# Patient Record
Sex: Female | Born: 1980 | Race: White | Hispanic: No | Marital: Married | Smoking: Never smoker
Health system: Southern US, Community
[De-identification: ages and names within clinical notes are randomized; demographics above are authoritative.]

## PROBLEM LIST (undated history)

## (undated) DIAGNOSIS — Z8739 Personal history of other diseases of the musculoskeletal system and connective tissue: Secondary | ICD-10-CM

## (undated) DIAGNOSIS — F840 Autistic disorder: Secondary | ICD-10-CM

## (undated) DIAGNOSIS — I341 Nonrheumatic mitral (valve) prolapse: Secondary | ICD-10-CM

## (undated) HISTORY — PX: WISDOM TOOTH EXTRACTION: SHX21

## (undated) HISTORY — DX: Personal history of other diseases of the musculoskeletal system and connective tissue: Z87.39

## (undated) HISTORY — DX: Nonrheumatic mitral (valve) prolapse: I34.1

## (undated) HISTORY — DX: Autistic disorder: F84.0

## (undated) HISTORY — PX: BACK SURGERY: SHX140

---

## 2002-10-21 ENCOUNTER — Other Ambulatory Visit: Admission: RE | Admit: 2002-10-21 | Discharge: 2002-10-21 | Payer: Self-pay | Admitting: Gynecology

## 2003-12-14 ENCOUNTER — Other Ambulatory Visit: Admission: RE | Admit: 2003-12-14 | Discharge: 2003-12-14 | Payer: Self-pay | Admitting: Gynecology

## 2004-12-14 ENCOUNTER — Other Ambulatory Visit: Admission: RE | Admit: 2004-12-14 | Discharge: 2004-12-14 | Payer: Self-pay | Admitting: Gynecology

## 2005-12-16 ENCOUNTER — Other Ambulatory Visit: Admission: RE | Admit: 2005-12-16 | Discharge: 2005-12-16 | Payer: Self-pay | Admitting: Gynecology

## 2007-04-12 ENCOUNTER — Observation Stay: Payer: Self-pay

## 2007-04-17 ENCOUNTER — Inpatient Hospital Stay: Payer: Self-pay

## 2012-01-10 ENCOUNTER — Ambulatory Visit (INDEPENDENT_AMBULATORY_CARE_PROVIDER_SITE_OTHER): Payer: Managed Care, Other (non HMO) | Admitting: Women's Health

## 2012-01-10 ENCOUNTER — Encounter: Payer: Self-pay | Admitting: Women's Health

## 2012-01-10 ENCOUNTER — Other Ambulatory Visit (HOSPITAL_COMMUNITY)
Admission: RE | Admit: 2012-01-10 | Discharge: 2012-01-10 | Disposition: A | Discharge status: Home / Self Care | Payer: Managed Care, Other (non HMO) | Source: Ambulatory Visit | Attending: Obstetrics and Gynecology | Admitting: Obstetrics and Gynecology

## 2012-01-10 VITALS — BP 110/70 | Ht 70.5 in | Wt 135.0 lb

## 2012-01-10 DIAGNOSIS — Z01419 Encounter for gynecological examination (general) (routine) without abnormal findings: Secondary | ICD-10-CM

## 2012-01-10 DIAGNOSIS — N926 Irregular menstruation, unspecified: Secondary | ICD-10-CM

## 2012-01-10 LAB — CBC WITH DIFFERENTIAL/PLATELET
Basophils Relative: 0 % (ref 0–1)
Eosinophils Absolute: 0.6 10*3/uL (ref 0.0–0.7)
Eosinophils Relative: 5 % (ref 0–5)
HCT: 41 % (ref 36.0–46.0)
Hemoglobin: 13.9 g/dL (ref 12.0–15.0)
Lymphs Abs: 2.5 10*3/uL (ref 0.7–4.0)
MCH: 28.9 pg (ref 26.0–34.0)
MCHC: 33.9 g/dL (ref 30.0–36.0)
MCV: 85.2 fL (ref 78.0–100.0)
Monocytes Absolute: 0.7 10*3/uL (ref 0.1–1.0)
Monocytes Relative: 6 % (ref 3–12)
RBC: 4.81 MIL/uL (ref 3.87–5.11)

## 2012-01-10 LAB — TSH: TSH: 1.604 u[IU]/mL (ref 0.350–4.500)

## 2012-01-10 LAB — PROLACTIN: Prolactin: 5.1 ng/mL

## 2012-01-10 NOTE — Progress Notes (Signed)
Andrea Watson [birth year] [id number]    History:    The patient presents for annual exam.  Monthly 5-7 day cycles/no contraception x1 year/desiring conception. First pregnancy conceived after 3-4 months with no contraception. Has used ovulation predictors, positive for ovulation most months. History of normal Paps.   Past medical history, past surgical history, family history and social history were all reviewed and documented in the EPIC chart. 31-year-old son Minerva Areola doing well.   ROS:  A  ROS was performed and pertinent positives and negatives are included in the history.  Exam:  Filed Vitals:   01/10/12 1401  BP: 110/70    General appearance:  Normal Head/Neck:  Normal, without cervical or supraclavicular adenopathy. Thyroid:  Symmetrical, normal in size, without palpable masses or nodularity. Respiratory  Effort:  Normal  Auscultation:  Clear without wheezing or rhonchi Cardiovascular  Auscultation:  Regular rate, without rubs, murmurs or gallops  Edema/varicosities:  Not grossly evident Abdominal  Soft,nontender, without masses, guarding or rebound.  Liver/spleen:  No organomegaly noted  Hernia:  None appreciated  Skin  Inspection:  Grossly normal  Palpation:  Grossly normal Neurologic/psychiatric  Orientation:  Normal with appropriate conversation.  Mood/affect:  Normal  Genitourinary    Breasts: Examined lying and sitting.     Right: Without masses, retractions, discharge or axillary adenopathy.     Left: Without masses, retractions, discharge or axillary adenopathy.   Inguinal/mons:  Normal without inguinal adenopathy  External genitalia:  Normal  BUS/Urethra/Skene's glands:  Normal  Bladder:  Normal  Vagina:  Normal  Cervix:  Normal  Uterus:  normal in size, shape and contour.  Midline and mobile  Adnexa/parametria:     Rt: Without masses or tenderness.   Lt: Without masses or tenderness.  Anus and perineum: Normal  Digital rectal exam:  Assessment/Plan:   31 y.o. MWF G1P1 for annual exam.  No contraception x1 year without pregnancy Normal GYN exam  Plan: CBC, TSH, prolactin, UA, Pap. SBE's, exercise, calcium rich diet, MVI daily encouraged. Continue frequent intercourse, ovulation predictors reviewed, return to office with missed cycle for viability ultrasound. Is aware we no longer deliver.      Harrington Challenger [facility], 2:33 PM 01/10/2012

## 2012-01-10 NOTE — Patient Instructions (Signed)

## 2012-01-11 LAB — URINALYSIS W MICROSCOPIC + REFLEX CULTURE
Bilirubin Urine: NEGATIVE
Casts: NONE SEEN
Glucose, UA: NEGATIVE mg/dL
Hgb urine dipstick: NEGATIVE
Leukocytes, UA: NEGATIVE
Protein, ur: NEGATIVE mg/dL
pH: 5 (ref 5.0–8.0)

## 2012-04-03 ENCOUNTER — Telehealth: Payer: Self-pay | Admitting: Women's Health

## 2012-04-03 DIAGNOSIS — N926 Irregular menstruation, unspecified: Secondary | ICD-10-CM

## 2012-04-03 NOTE — Telephone Encounter (Signed)
Patient call for clarification. Has a normal TSH and prolactin, we were going to check a estradiol and FSH on day 3 of her cycle and progesterone level on day 20-25. Will call back to schedule appointment on day 3 of next cycle.

## 2012-05-27 ENCOUNTER — Telehealth: Payer: Self-pay | Admitting: *Deleted

## 2012-05-27 NOTE — Telephone Encounter (Signed)
Pt called with questions why FSH and estradiol level were to be drawn. Pt informed due to irregular cycles per nancy note.

## 2012-05-29 ENCOUNTER — Other Ambulatory Visit: Payer: Managed Care, Other (non HMO)

## 2012-05-29 DIAGNOSIS — N926 Irregular menstruation, unspecified: Secondary | ICD-10-CM

## 2012-05-29 LAB — ESTRADIOL: Estradiol: 69.2 pg/mL

## 2012-06-01 ENCOUNTER — Other Ambulatory Visit: Payer: Self-pay | Admitting: Women's Health

## 2012-06-01 DIAGNOSIS — N926 Irregular menstruation, unspecified: Secondary | ICD-10-CM

## 2012-06-19 ENCOUNTER — Other Ambulatory Visit: Payer: Managed Care, Other (non HMO)

## 2012-06-19 DIAGNOSIS — N926 Irregular menstruation, unspecified: Secondary | ICD-10-CM

## 2012-06-24 ENCOUNTER — Ambulatory Visit (INDEPENDENT_AMBULATORY_CARE_PROVIDER_SITE_OTHER): Payer: Managed Care, Other (non HMO) | Admitting: Gynecology

## 2012-06-24 ENCOUNTER — Encounter: Payer: Self-pay | Admitting: Gynecology

## 2012-06-24 DIAGNOSIS — N926 Irregular menstruation, unspecified: Secondary | ICD-10-CM

## 2012-06-24 NOTE — Patient Instructions (Signed)
Arrange for semen analysis. Check progesterone level day 20-25 of your menstrual cycle. Check ovulation predictor urine kits for several cycles and continue to attempt pregnancy. If without pregnancy then follow up for further evaluation.

## 2012-06-24 NOTE — Progress Notes (Signed)
Patient presents with her mother to discuss infertility. She has high functioning autism, she's been trying to get pregnant over the past year without success. She has had one pregnancy with the same husband after trying one to 2 months. Her menses are monthly although very 25-30 days. She tried ovulation predictor kits x2 cycles without a surge.  She had blood work ordered by Harriett Sine that showed a normal FSH prolactin estradiol level and TSH with a progesterone level 5.4 around day 20.  Her periods will last up to 7 days with some cramping. She does not have breast tenderness bloating or other moliminal symptoms. Uneventful vaginal birth and no postpartum complications such as endometriosis or infections. No galactorrhea weight changes or exercise changes. Her husband has had a bout of severe pneumonia with high fevers 3 years ago but no significant medical history.  Exam with mother present External BUS vagina with light menses flow. Cervix normal. Uterus normal size midline mobile nontender. Adnexa without masses or tenderness.  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 Some ovulatory irregularity with menses ranging from 25-30 days. Negative LH kits x2 we'll place progesterone x1 at 5.  Reviewed with patient and her mother general approach to secondary infertility. Would recommend semen analysis now, recheck of luteal phase progesterone day 20-25 and continued attempts over several more cycles with LH kit each month. If remains without pregnancy possible sonohysterogram due to the premenstrual spotting/HSG rule out tubal factor/possible Clomid stimulation. I reviewed the risks of Clomid to include multiple gestation, hyperstimulation syndrome and possible ovarian cancer linkage. Patient and her mother agree with the plan I will arrange for the semen analysis progesterone level and continued attempts times several cycles. She is on a multivitamin with folic acid.  Patient does have a  history of spina bifida occulta repair done at the same time a scoliosis surgery and told her that I would check about extra folic acid recommendations. We'll also discuss fragile X testing as I did not discuss that with her her mother given her autism history when I follow up with her about the folic acid.

## 2012-06-26 ENCOUNTER — Telehealth: Payer: Self-pay | Admitting: Women's Health

## 2012-06-26 NOTE — Telephone Encounter (Signed)
Telephone call to review fertility management after review with Dr. Audie Box. Encouraged to take 4 mg Folic acid daily, history of scoliosis with occult spina bifida noted at time of surgery. Also asked if she has had any genetic testing done. States not sure and asked me to call her mother.   Telephone call to mother/Andrea Watson to discuss fragile X. genetic testing. States it was not done per choice when Andrea Watson was younger. States does not feel that that is the problem and not sure she wants done. Will think about it and will get back to [place]. States problem was learning/spatial/social skills, delayed responses, autistic.

## 2012-07-01 ENCOUNTER — Institutional Professional Consult (permissible substitution): Payer: Managed Care, Other (non HMO) | Admitting: Gynecology

## 2012-07-14 ENCOUNTER — Other Ambulatory Visit: Payer: Managed Care, Other (non HMO)

## 2012-07-14 ENCOUNTER — Ambulatory Visit (INDEPENDENT_AMBULATORY_CARE_PROVIDER_SITE_OTHER): Payer: Managed Care, Other (non HMO) | Admitting: Anesthesiology

## 2012-07-14 DIAGNOSIS — N926 Irregular menstruation, unspecified: Secondary | ICD-10-CM

## 2012-07-14 DIAGNOSIS — Z23 Encounter for immunization: Secondary | ICD-10-CM

## 2014-06-06 ENCOUNTER — Encounter: Payer: Self-pay | Admitting: Gynecology

## 2014-12-21 ENCOUNTER — Emergency Department (INDEPENDENT_AMBULATORY_CARE_PROVIDER_SITE_OTHER): Payer: Self-pay

## 2014-12-21 ENCOUNTER — Emergency Department (INDEPENDENT_AMBULATORY_CARE_PROVIDER_SITE_OTHER)
Admission: EM | Admit: 2014-12-21 | Discharge: 2014-12-21 | Disposition: A | Discharge status: Home / Self Care | Payer: Self-pay | Source: Home / Self Care | Attending: Family Medicine | Admitting: Family Medicine

## 2014-12-21 ENCOUNTER — Encounter (HOSPITAL_COMMUNITY): Payer: Self-pay | Admitting: Emergency Medicine

## 2014-12-21 DIAGNOSIS — M5412 Radiculopathy, cervical region: Secondary | ICD-10-CM

## 2014-12-21 MED ORDER — PREDNISONE 5 MG (48) PO TBPK: 5.0000 mg | ORAL_TABLET | Freq: Every day | ORAL | Status: DC
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
## 2014-12-21 NOTE — Discharge Instructions (Signed)
Thank you for coming in today. Follow up with Dr. Farris HasKramer.  Cervical Radiculopathy Cervical radiculopathy happens when a nerve in the neck is pinched or bruised by a slipped (herniated) disk or by arthritic changes in the bones of the cervical spine. This can occur due to an injury or as part of the normal aging process. Pressure on the cervical nerves can cause pain or numbness that runs from your neck all the way down into your arm and fingers. CAUSES  There are many possible causes, including:  Injury.  Muscle tightness in the neck from overuse.  Swollen, painful joints (arthritis).  Breakdown or degeneration in the bones and joints of the spine (spondylosis) due to aging.  Bone spurs that may develop near the cervical nerves. SYMPTOMS  Symptoms include pain, weakness, or numbness in the affected arm and hand. Pain can be severe or irritating. Symptoms may be worse when extending or turning the neck. DIAGNOSIS  Your caregiver will ask about your symptoms and do a physical exam. He or she may test your strength and reflexes. X-rays, CT scans, and MRI scans may be needed in cases of injury or if the symptoms do not go away after a period of time. Electromyography (EMG) or nerve conduction testing may be done to study how your nerves and muscles are working. TREATMENT  Your caregiver may recommend certain exercises to help relieve your symptoms. Cervical radiculopathy can, and often does, get better with time and treatment. If your problems continue, treatment options may include:  Wearing a soft collar for short periods of time.  Physical therapy to strengthen the neck muscles.  Medicines, such as nonsteroidal anti-inflammatory drugs (NSAIDs), oral corticosteroids, or spinal injections.  Surgery. Different types of surgery may be done depending on the cause of your problems. HOME CARE INSTRUCTIONS   Put ice on the affected area.  Put ice in a plastic bag.  Place a towel between  your skin and the bag.  Leave the ice on for 15-20 minutes, 03-04 times a day or as directed by your caregiver.  If ice does not help, you can try using heat. Take a warm shower or bath, or use a hot water bottle as directed by your caregiver.  You may try a gentle neck and shoulder massage.  Use a flat pillow when you sleep.  Only take over-the-counter or prescription medicines for pain, discomfort, or fever as directed by your caregiver.  If physical therapy was prescribed, follow your caregiver's directions.  If a soft collar was prescribed, use it as directed. SEEK IMMEDIATE MEDICAL CARE IF:   Your pain gets much worse and cannot be controlled with medicines.  You have weakness or numbness in your hand, arm, face, or leg.  You have a high fever or a stiff, rigid neck.  You lose bowel or bladder control (incontinence).  You have trouble with walking, balance, or speaking. MAKE SURE YOU:   Understand these instructions.  Will watch your condition.  Will get help right away if you are not doing well or get worse. Document Released: 04/16/2001 Document Revised: 10/14/2011 Document Reviewed: 03/05/2011 [facility] Patient Information 2015 [place], [place]. This information is not intended to replace advice given to you by your health care provider. Make sure you discuss any questions you have with your health care provider.
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
## 2015-08-09 ENCOUNTER — Ambulatory Visit (INDEPENDENT_AMBULATORY_CARE_PROVIDER_SITE_OTHER): Payer: BLUE CROSS/BLUE SHIELD | Admitting: Physician Assistant

## 2015-08-09 ENCOUNTER — Encounter: Payer: Self-pay | Admitting: Physician Assistant

## 2015-08-09 VITALS — BP 108/78 | HR 76 | Temp 97.5°F | Resp 18 | Wt 144.0 lb

## 2015-08-09 DIAGNOSIS — H66003 Acute suppurative otitis media without spontaneous rupture of ear drum, bilateral: Secondary | ICD-10-CM

## 2015-08-09 DIAGNOSIS — J988 Other specified respiratory disorders: Secondary | ICD-10-CM | POA: Diagnosis not present

## 2015-08-09 DIAGNOSIS — B9689 Other specified bacterial agents as the cause of diseases classified elsewhere: Principal | ICD-10-CM

## 2015-08-09 MED ORDER — AMOXICILLIN 875 MG PO TABS: 875.0000 mg | ORAL_TABLET | Freq: Two times a day (BID) | ORAL | Status: DC

## 2015-08-10 NOTE — Progress Notes (Signed)
    Patient ID: Andrea Watson MRN: [id number], DOB: [birth year], 35 y.o. Date of Encounter: 08/10/2015, 7:33 AM    Chief Complaint:  Chief Complaint  Patient presents with  . bilat ear pain x 1 week     HPI: 35 y.o. year old white female history significant for autism--- says that both of her ears have been achy for the past week. Is that she has also been having nasal congestion and mucus from the nose. No significant chest congestion or sore throat. No fevers or chills.     Home Meds:   Outpatient Prescriptions Prior to Visit  Medication Sig Dispense Refill  . fexofenadine (ALLEGRA) 30 MG tablet Take 30 mg by mouth 2 (two) times daily.    . Multiple Vitamin (MULTIVITAMIN) tablet Take 1 tablet by mouth daily.    . predniSONE (STERAPRED UNI-PAK 48 TAB) 5 MG (48) TBPK tablet Take 1 tablet (5 mg total) by mouth daily. 12 day dosepack po 48 tablet 0   No facility-administered medications prior to visit.    Allergies:  Allergies  Allergen Reactions  . Latex Rash  . Macrobid [Nitrofurantoin Macrocrystal] Rash      Review of Systems: See HPI for pertinent ROS. All other ROS negative.    Physical Exam: Blood pressure 108/78, pulse 76, temperature 97.5 F (36.4 C), temperature source Oral, resp. rate 18, weight 144 lb (65.318 kg)., Body mass index is 20.36 kg/(m^2). General:  WNWD WF. Appears in no acute distress. HEENT: Normocephalic, atraumatic, eyes without discharge, sclera non-icteric, nares are without discharge. Bilateral auditory canals clear, TM's are without perforation.  Bilateral TMs appeared dull.Golden color.  Oral cavity moist, posterior pharynx without exudate, erythema, peritonsillar abscess. No tenderness with percussion of frontal or maxillary sinuses bilaterally. Neck: Supple. No thyromegaly. No lymphadenopathy. Lungs: Clear bilaterally to auscultation without wheezes, rales, or rhonchi. Breathing is unlabored. Heart: Regular rhythm. No murmurs, rubs, or  gallops. Msk:  Strength and tone normal for age. Extremities/Skin: Warm and dry.  Neuro: Alert and oriented X 3. Moves all extremities spontaneously. Gait is normal. CNII-XII grossly in tact. Psych:  Responds to questions appropriately with a normal affect.     ASSESSMENT AND PLAN:  35 y.o. year old female with  1. Bacterial respiratory infection - amoxicillin (AMOXIL) 875 MG tablet; Take 1 tablet (875 mg total) by mouth 2 (two) times daily.  Dispense: 14 tablet; Refill: 0  2. Acute suppurative otitis media of both ears without spontaneous rupture of tympanic membranes, recurrence not specified - amoxicillin (AMOXIL) 875 MG tablet; Take 1 tablet (875 mg total) by mouth 2 (two) times daily.  Dispense: 14 tablet; Refill: 0  She is to start amoxicillin immediately, take as directed, and complete all of it. Can use over-the-counter decongestants for symptom relief in the interim. Follow-up if symptoms do not resolve with completion of amoxicillin.  [address]igned, Nichalos Brenton Beth [name], [place], [facility] FlorenceBSFM 08/10/2015 7:33 AM

## 2016-07-15 DIAGNOSIS — Z3040 Encounter for surveillance of contraceptives, unspecified: Secondary | ICD-10-CM | POA: Diagnosis not present

## 2016-07-15 DIAGNOSIS — Z681 Body mass index (BMI) 19 or less, adult: Secondary | ICD-10-CM | POA: Diagnosis not present

## 2016-07-15 DIAGNOSIS — Z01419 Encounter for gynecological examination (general) (routine) without abnormal findings: Secondary | ICD-10-CM | POA: Diagnosis not present

## 2017-01-01 ENCOUNTER — Ambulatory Visit (INDEPENDENT_AMBULATORY_CARE_PROVIDER_SITE_OTHER): Payer: BLUE CROSS/BLUE SHIELD | Admitting: Physician Assistant

## 2017-01-01 ENCOUNTER — Encounter: Payer: Self-pay | Admitting: Physician Assistant

## 2017-01-01 VITALS — BP 118/80 | HR 68 | Temp 98.3°F | Resp 16 | Wt 134.4 lb

## 2017-01-01 DIAGNOSIS — Z0000 Encounter for general adult medical examination without abnormal findings: Secondary | ICD-10-CM

## 2017-01-01 DIAGNOSIS — F840 Autistic disorder: Secondary | ICD-10-CM | POA: Diagnosis not present

## 2017-01-01 LAB — LIPID PANEL
CHOL/HDL RATIO: 2.2 ratio (ref <5.0)
Cholesterol: 186 mg/dL (ref <200)
HDL: 83 mg/dL (ref >50)
LDL CALC: 77 mg/dL (ref <100)
Triglycerides: 130 mg/dL (ref <150)
VLDL: 26 mg/dL (ref <30)

## 2017-01-01 LAB — COMPLETE METABOLIC PANEL WITH GFR
ALT: 19 U/L (ref 6–29)
AST: 18 U/L (ref 10–30)
Albumin: 4.1 g/dL (ref 3.6–5.1)
Alkaline Phosphatase: 35 U/L (ref 33–115)
BUN: 10 mg/dL (ref 7–25)
CALCIUM: 9.5 mg/dL (ref 8.6–10.2)
CHLORIDE: 106 mmol/L (ref 98–110)
CO2: 23 mmol/L (ref 20–31)
CREATININE: 0.86 mg/dL (ref 0.50–1.10)
GFR, Est African American: >89 mL/min (ref >=60)
GFR, Est Non African American: 88 mL/min (ref >=60)
Glucose, Bld: 79 mg/dL (ref 70–99)
POTASSIUM: 4.1 mmol/L (ref 3.5–5.3)
Sodium: 140 mmol/L (ref 135–146)
Total Bilirubin: 0.5 mg/dL (ref 0.2–1.2)
Total Protein: 6.8 g/dL (ref 6.1–8.1)

## 2017-01-01 LAB — CBC WITH DIFFERENTIAL/PLATELET
BASOS PCT: 0 %
Basophils Absolute: 0 cells/uL (ref 0–200)
Eosinophils Absolute: 134 cells/uL (ref 15–500)
Eosinophils Relative: 2 %
HEMATOCRIT: 41.5 % (ref 35.0–45.0)
Hemoglobin: 13.8 g/dL (ref 12.0–15.0)
LYMPHS PCT: 27 %
Lymphs Abs: 1809 cells/uL (ref 850–3900)
MCH: 30 pg (ref 27.0–33.0)
MCHC: 33.3 g/dL (ref 32.0–36.0)
MCV: 90.2 fL (ref 80.0–100.0)
MONO ABS: 335 {cells}/uL (ref 200–950)
MPV: 9.8 fL (ref 7.5–12.5)
Monocytes Relative: 5 %
Neutro Abs: 4422 cells/uL (ref 1500–7800)
Neutrophils Relative %: 66 %
PLATELETS: 328 10*3/uL (ref 140–400)
RBC: 4.6 MIL/uL (ref 3.80–5.10)
RDW: 12.7 % (ref 11.0–15.0)
WBC: 6.7 10*3/uL (ref 3.8–10.8)

## 2017-01-01 LAB — TSH: TSH: 1.25 mIU/L

## 2017-01-01 NOTE — Progress Notes (Signed)
Patient ID: Andrea Watson MRN: [id number], DOB: [birth year], 36 y.o. Date of Encounter: 01/01/2017,   Chief Complaint: Physical (CPE)  HPI: 36 y.o. y/o female  here for CPE.   She has history of autism. 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 Has one child who is a 36-year-old boy. Says that he is home-schooled. Says that he just completed the fourth grade and says that he also plays the guitar.  She has no specific complaints or concerns that she wants to address today. She is fasting to check labs.  Review of Systems: Consitutional: No fever, chills, fatigue, night sweats, lymphadenopathy. No significant/unexplained weight changes. Eyes: No visual changes, eye redness, or discharge. ENT/Mouth: No ear pain, sore throat, nasal drainage, or sinus pain. Cardiovascular: No chest pressure,heaviness, tightness or squeezing, even with exertion. No increased shortness of breath or dyspnea on exertion.No palpitations, edema, orthopnea, PND. Respiratory: No cough, hemoptysis, SOB, or wheezing. Gastrointestinal: No anorexia, dysphagia, reflux, pain, nausea, vomiting, hematemesis, diarrhea, constipation, BRBPR, or melena. Breast: No mass, nodules, bulging, or retraction. No skin changes or inflammation. No nipple discharge. No lymphadenopathy. Genitourinary: No dysuria, hematuria, incontinence, vaginal discharge, pruritis, burning, abnormal bleeding, or pain. Musculoskeletal: No decreased ROM, No joint pain or swelling. No significant pain in neck, back, or extremities. Skin: No rash, pruritis, or concerning lesions. Neurological: No headache, dizziness, syncope, seizures, tremors, memory loss, coordination problems, or paresthesias. Psychological: No anxiety, depression, hallucinations, SI/HI. Endocrine: No polydipsia, polyphagia, polyuria, or known diabetes.No increased fatigue. No palpitations/rapid heart rate. No significant/unexplained weight change. All other systems were reviewed and are  otherwise negative.  Past Medical History:  Diagnosis Date  . Autism      Past Surgical History:  Procedure Laterality Date  . BACK SURGERY     FOR SCOLIOSIS/Spina bifida occulta repair  . WISDOM TOOTH EXTRACTION      Home Meds:  Outpatient Medications Prior to Visit  Medication Sig Dispense Refill  . fexofenadine (ALLEGRA) 30 MG tablet Take 30 mg by mouth 2 (two) times daily.    . Multiple Vitamin (MULTIVITAMIN) tablet Take 1 tablet by mouth daily.    [name] amoxicillin (AMOXIL) 875 MG tablet Take 1 tablet (875 mg total) by mouth 2 (two) times daily. 14 tablet 0   No facility-administered medications prior to visit.     Allergies:  Allergies  Allergen Reactions  . Latex Rash  . Macrobid [Nitrofurantoin Macrocrystal] Rash  . Tetanus Toxoids Rash    Rash, chills,vomiting    Social History   Social History  . Marital status: Married    Spouse name: N/A  . Number of children: N/A  . Years of education: N/A   Occupational History  . Not on file.   Social History Main Topics  . Smoking status: Never Smoker  . Smokeless tobacco: Never Used  . Alcohol use No  . Drug use: No  . Sexual activity: Yes    Birth control/ protection: None   Other Topics Concern  . Not on file   Social History Narrative  . No narrative on file    Family History  Problem Relation Age of Onset  . Hypertension Father   . Breast cancer Paternal Grandmother   . Cancer Paternal Grandmother        BONE, THROAT    Physical Exam: Blood pressure 118/80, pulse 68, temperature 98.3 F (36.8 C), temperature source Oral, resp. rate 16, weight 134 lb 6.4 oz (61 kg), last menstrual period 12/17/2016, SpO2  98 %., Body mass index is 19.01 kg/m. General: Well developed, well nourished WF. Appears in no acute distress. HEENT: Normocephalic, atraumatic. Conjunctiva pink, sclera non-icteric. Pupils 2 mm constricting to 1 mm, round, regular, and equally reactive to light and accomodation. EOMI. Internal  auditory canal clear. TMs with good cone of light and without pathology. Nasal mucosa pink. Nares are without discharge. No sinus tenderness. Oral mucosa pink.  Pharynx without exudate.   Neck: Supple. Trachea midline. No thyromegaly. Full ROM. No lymphadenopathy.No Carotid Bruits. Lungs: Clear to auscultation bilaterally without wheezes, rales, or rhonchi. Breathing is of normal effort and unlabored. Cardiovascular: RRR with S1 S2. No murmurs, rubs, or gallops. Distal pulses 2+ symmetrically. No carotid or abdominal bruits. Breast: Per Gyn. Abdomen: Soft, non-tender, non-distended with normoactive bowel sounds. No hepatosplenomegaly or masses. No rebound/guarding. No CVA tenderness. No hernias.  Genitourinary: Per Gyn Musculoskeletal: Full range of motion and 5/5 strength throughout.  Skin: Warm and moist without erythema, ecchymosis, wounds, or rash. Neuro: A+Ox3. CN II-XII grossly intact. Moves all extremities spontaneously. Full sensation throughout. Normal gait.  Psych:  H/O Autism. While I am documenting in computer, she is talking about random subjects/information--about her dog, etc. She does not open mouth much when she talks.    Assessment/Plan:  36 y.o. y/o female here for CPE  1. Encounter for preventive health examination A. Screening Labs: She is fasting for screening labs today. - CBC with Differential/Platelet - COMPLETE METABOLIC PANEL WITH GFR - Lipid panel - TSH  B. Pap: She sees GYN. Per GYN C. Screening Mammogram: She sees GYN. Per GYN  D. DEXA/BMD:  She sees GYN. Per GYN  E. Colorectal Cancer Screening: She has no indication to require colon cancer screening at this age  F. Immunizations:  Influenza:-----------N/A-----May Tetanus:------- she states that she has had a reaction to the tetanus shot in the past. Says that it was done at the health department and that her mother told her she had a reaction.  Pneumococcal: She has no indication to require a  pneumonia vaccine until age 36 Shingrix--not indicated until age 36  2. Autism  Signed, Shon HaleMary Beth Little RockDixon, [place], [facility]BS[facility] 01/01/2017 10:58 AM

## 2017-02-14 DIAGNOSIS — N938 Other specified abnormal uterine and vaginal bleeding: Secondary | ICD-10-CM | POA: Diagnosis not present

## 2017-07-30 DIAGNOSIS — Z681 Body mass index (BMI) 19 or less, adult: Secondary | ICD-10-CM | POA: Diagnosis not present

## 2017-07-30 DIAGNOSIS — Z3040 Encounter for surveillance of contraceptives, unspecified: Secondary | ICD-10-CM | POA: Diagnosis not present

## 2017-07-30 DIAGNOSIS — Z01419 Encounter for gynecological examination (general) (routine) without abnormal findings: Secondary | ICD-10-CM | POA: Diagnosis not present

## 2017-08-27 DIAGNOSIS — J019 Acute sinusitis, unspecified: Secondary | ICD-10-CM | POA: Diagnosis not present

## 2018-07-31 DIAGNOSIS — Z01419 Encounter for gynecological examination (general) (routine) without abnormal findings: Secondary | ICD-10-CM | POA: Diagnosis not present

## 2018-07-31 DIAGNOSIS — R011 Cardiac murmur, unspecified: Secondary | ICD-10-CM | POA: Diagnosis not present

## 2018-07-31 DIAGNOSIS — Z681 Body mass index (BMI) 19 or less, adult: Secondary | ICD-10-CM | POA: Diagnosis not present

## 2018-07-31 DIAGNOSIS — Z3040 Encounter for surveillance of contraceptives, unspecified: Secondary | ICD-10-CM | POA: Diagnosis not present

## 2018-07-31 DIAGNOSIS — Z124 Encounter for screening for malignant neoplasm of cervix: Secondary | ICD-10-CM | POA: Diagnosis not present

## 2018-08-05 DIAGNOSIS — I341 Nonrheumatic mitral (valve) prolapse: Secondary | ICD-10-CM

## 2018-08-05 HISTORY — DX: Nonrheumatic mitral (valve) prolapse: I34.1

## 2018-08-12 ENCOUNTER — Ambulatory Visit (INDEPENDENT_AMBULATORY_CARE_PROVIDER_SITE_OTHER): Payer: BLUE CROSS/BLUE SHIELD | Admitting: Cardiology

## 2018-08-12 ENCOUNTER — Encounter: Payer: Self-pay | Admitting: Cardiology

## 2018-08-12 VITALS — BP 110/78 | HR 75 | Ht 71.75 in | Wt 137.0 lb

## 2018-08-12 DIAGNOSIS — I340 Nonrheumatic mitral (valve) insufficiency: Secondary | ICD-10-CM | POA: Diagnosis not present

## 2018-08-12 DIAGNOSIS — R9431 Abnormal electrocardiogram [ECG] [EKG]: Secondary | ICD-10-CM | POA: Diagnosis not present

## 2018-08-12 NOTE — Assessment & Plan Note (Addendum)
Quite notable HSM on exam in apical location - consistent with MR - will check 2 D Echo to determine extent & etiology.  Discussed high likelihood of SBE prophylaxis requirement.

## 2018-08-12 NOTE — Assessment & Plan Note (Signed)
R axis deviation & RSR' in setting of ? MR murmur -- ? RVH & pulm HTN.   Check 2 D Echo.

## 2018-08-12 NOTE — Progress Notes (Signed)
PCP: Dorena Bodo, PA-C  Clinic Note: Chief Complaint  Patient presents with  . New Patient (Initial Visit)    Murmur on exam    HPI: Andrea Watson is a 38 y.o. female who is being seen today for the evaluation of heart murmur at the request of Rivard, Dois Davenport, MD.  Andrea Watson was recently seen by her gynecologist back on December 27 and was noted to have a murmur on cardiac exam.  Recent Hospitalizations: None,  Studies Personally Reviewed - (if available, images/films reviewed: From Epic Chart or Care Everywhere)  None  Interval History: Andrea Watson is a very pleasant 38 year old woman who is a highly functioning Asperger's patient who presents here today for evaluation of a murmur.  She has never been told that she had a murmur before her most recent evaluation.  She is here with her mother to assist with history.  As far as they can recall, she has never had a diagnosis of rheumatic fever, but clearly had the normal childhood diseases including strep throat.  Other than her Asperger's the only other major clinical feature was a scoliosis surgery as a child.  She recovered from that very well. She was evaluated for Marfan's as a child, and was negative.  No other connective tissue disorders.  What her mother notes is that over the last 15 years or so, while she may recover from the surgery without any difficulty, she has a tendency to take a long time to recover from a cold with more than usual dyspnea and prolonged recovery periods. She denies any recent fevers chills or cold sweats.  Nothing to suggest endocarditis.   She denies any cardiac symptoms whatsoever: No chest pain or shortness of breath with rest or exertion. No PND, orthopnea or edema.  No palpitations, lightheadedness, dizziness, weakness or syncope/near syncope. No TIA/amaurosis fugax symptoms. No melena, hematochezia, hematuria, or epstaxis. No claudication.  ROS: A comprehensive was performed. Review of Systems    Constitutional: Negative for chills and fever.  Respiratory: Positive for cough. Negative for sputum production, shortness of breath and wheezing.        Recent URI over the weekend - now better  Gastrointestinal: Negative for blood in stool and melena.  Genitourinary: Negative for hematuria.  Musculoskeletal: Negative for back pain (h/o scoliosis Sgx as child).  Neurological: Negative for dizziness, loss of consciousness and weakness.  Psychiatric/Behavioral:       Asperger's - highly functioning  All other systems reviewed and are negative.  I have reviewed and (if needed) personally updated the patient's problem list, medications, allergies, past medical and surgical history, social and family history.   Past Medical History:  Diagnosis Date  . Autism   . History of scoliosis    Repair surgery in childhood    Past Surgical History:  Procedure Laterality Date  . BACK SURGERY     FOR SCOLIOSIS/Spina bifida occulta repair  . WISDOM TOOTH EXTRACTION      Current Meds  Medication Sig  . b complex vitamins capsule Take 1 capsule by mouth daily.  . drospirenone-ethinyl estradiol (YAZ,GIANVI,LORYNA) 3-0.02 MG tablet Take 1 tablet by mouth daily.  . fexofenadine (ALLEGRA) 30 MG tablet Take 30 mg by mouth 2 (two) times daily.  . Multiple Vitamin (MULTIVITAMIN) tablet Take 1 tablet by mouth daily.    Allergies  Allergen Reactions  . Latex Rash  . Macrobid [Nitrofurantoin Macrocrystal] Rash  . Tetanus Toxoids Rash    Rash, chills,vomiting  Social History   Tobacco Use  . Smoking status: Never Smoker  . Smokeless tobacco: Never Used  Substance Use Topics  . Alcohol use: No  . Drug use: No   Social History   Social History Narrative  . Not on file    family history includes Breast cancer in her paternal grandmother; Cancer in her paternal grandmother; Hypertension in her father.  Wt Readings from Last 3 Encounters:  08/12/18 137 lb (62.1 kg)  01/01/17 134 lb 6.4  oz (61 kg)  08/09/15 144 lb (65.3 kg)    PHYSICAL EXAM BP 110/78 (BP Location: Right Arm)   Pulse 75   Ht 5' 11.75" (1.822 m)   Wt 137 lb (62.1 kg)   BMI 18.71 kg/m  Physical Exam  Constitutional: She is oriented to person, place, and time. She appears well-developed and well-nourished. No distress.  Tall, thin (~almost Marfanoid features).  HENT:  Head: Normocephalic and atraumatic.  Mouth/Throat: No oropharyngeal exudate.  Eyes: Pupils are equal, round, and reactive to light. Conjunctivae and EOM are normal. No scleral icterus.  Neck: Normal range of motion. Neck supple. No hepatojugular reflux and no JVD present. Carotid bruit is not present. No thyroid mass and no thyromegaly present.  Cardiovascular: Normal rate, regular rhythm and normal pulses.  No extrasystoles are present. PMI is not displaced. Exam reveals no gallop and no friction rub.  Murmur heard.  Medium-pitched blowing decrescendo holosystolic murmur is present with a grade of 3/6 at the apex. Pulmonary/Chest: Effort normal and breath sounds normal. No respiratory distress. She has no wheezes. She has no rales.  Abdominal: Soft. Bowel sounds are normal. She exhibits no distension. There is no abdominal tenderness. There is no rebound.  Musculoskeletal: Normal range of motion.        General: No edema.  Neurological: She is alert and oriented to person, place, and time. No cranial nerve deficit.  Psychiatric: She has a normal mood and affect. Her behavior is normal. Judgment and thought content normal.  Vitals reviewed.     Adult ECG Report  Rate: 75 ;  Rhythm: normal sinus rhythm and R Axis deviation (100 deg), RSR' (inc RBBB).  otherwise normal intervals & durations.  Normal voltage;   Narrative Interpretation: borderline abnormal EKG.  Other studies Reviewed: Additional studies/ records that were reviewed today include:  Recent Labs: None  ASSESSMENT / PLAN: Problem List Items Addressed This Visit     Abnormal EKG    R axis deviation & RSR' in setting of ? MR murmur -- ? RVH & pulm HTN.   Check 2 D Echo.      Relevant Orders   EKG 12-Lead   ECHOCARDIOGRAM COMPLETE   Mitral regurgitation - Primary    Quite notable HSM on exam in apical location - consistent with MR - will check 2 D Echo to determine extent & etiology.  Discussed high likelihood of SBE prophylaxis requirement.      Relevant Orders   EKG 12-Lead   ECHOCARDIOGRAM COMPLETE      I spent a total of 35-40 minutes with the patient and chart review. >  50% of the time was spent in direct patient consultation.   Current medicines are reviewed at length with the patient today.  (+/- concerns) n/a The following changes have been made:  n/a  Patient Instructions  Medication Instructions:  NOT NEEDED  If you need a refill on your cardiac medications before your next appointment, please call your pharmacy.  Lab work: NOT NEEDED If you have labs (blood work) drawn today and your tests are completely normal, you will receive your results only by: [name]. MyChart Message (if you have MyChart) OR . A paper copy in the mail If you have any lab test that is abnormal or we need to change your treatment, we will call you to review the results.  Testing/Procedures: SCHEDULE AT [address] Your physician has requested that you have an echocardiogram. Echocardiography is a painless test that uses sound waves to create images of your heart. It provides your doctor with information about the size and shape of your heart and how well your heart's chambers and valves are working. This procedure takes approximately one hour. There are no restrictions for this procedure.   Follow-Up: At [facility], you and your health needs are our priority.  As part of our continuing mission to provide you with exceptional heart care, we have created designated Provider Care Teams.  These Care Teams include your primary  Cardiologist (physician) and Advanced Practice Providers (APPs -  Physician Assistants and Nurse Practitioners) who all work together to provide you with the care you need, when you need it. . Your physician recommends that you schedule a follow-up appointment in 1 MONTH WITH DR Iyla Balzarini .   Any Other Special Instructions Will Be Listed Below (If Applicable).      Studies Ordered:   Orders Placed This Encounter  Procedures  . EKG 12-Lead  . ECHOCARDIOGRAM COMPLETE      Bryan Lemmaavid Marie Borowski, M.D., M.S. Interventional Cardiologist   Pager # [phone] Phone # [phone] [address] [place], [place] [address]   Thank you for choosing Heartcare at [facility]!!

## 2018-08-12 NOTE — Patient Instructions (Addendum)
Medication Instructions:   NOT NEEDED If you need a refill on your cardiac medications before your next appointment, please call your pharmacy.   Lab work: NOT NEEDED If you have labs (blood work) drawn today and your tests are completely normal, you will receive your results only by: . MyChart Message (if you have MyChart) OR . A paper copy in the mail If you have any lab test that is abnormal or we need to change your treatment, we will call you to review the results.  Testing/Procedures: SCHEDULE AT [address] Your physician has requested that you have an echocardiogram. Echocardiography is a painless test that uses sound waves to create images of your heart. It provides your doctor with information about the size and shape of your heart and how well your heart's chambers and valves are working. This procedure takes approximately one hour. There are no restrictions for this procedure.    Follow-Up: At [facility], you and your health needs are our priority.  As part of our continuing mission to provide you with exceptional heart care, we have created designated Provider Care Teams.  These Care Teams include your primary Cardiologist (physician) and Advanced Practice Providers (APPs -  Physician Assistants and Nurse Practitioners) who all work together to provide you with the care you need, when you need it. . Your physician recommends that you schedule a follow-up appointment in 1 MONTH WITH DR HARDING .   Any Other Special Instructions Will Be Listed Below (If Applicable).    

## 2018-08-13 ENCOUNTER — Telehealth: Payer: Self-pay | Admitting: Cardiology

## 2018-08-13 NOTE — Telephone Encounter (Signed)
Left message for patient to call.  She needs echo and follow up wih Dr Herbie Baltimore in a month.

## 2018-08-18 ENCOUNTER — Other Ambulatory Visit: Payer: Self-pay

## 2018-08-18 ENCOUNTER — Ambulatory Visit (HOSPITAL_COMMUNITY): Payer: BLUE CROSS/BLUE SHIELD | Attending: Cardiology

## 2018-08-18 DIAGNOSIS — I340 Nonrheumatic mitral (valve) insufficiency: Secondary | ICD-10-CM | POA: Diagnosis not present

## 2018-08-18 DIAGNOSIS — R9431 Abnormal electrocardiogram [ECG] [EKG]: Secondary | ICD-10-CM | POA: Diagnosis not present

## 2018-08-18 HISTORY — PX: TRANSTHORACIC ECHOCARDIOGRAM: SHX275

## 2018-08-31 ENCOUNTER — Telehealth: Payer: Self-pay | Admitting: Cardiology

## 2018-08-31 NOTE — Telephone Encounter (Signed)
Spoke to husband,  Echo Results given, per dpi  per husband - patient and mother will discuss and call back if they would like to proceed with TEE prior to next office appointment.

## 2018-08-31 NOTE — Telephone Encounter (Signed)
Follow Up:      Please call Nathaniel(husband), he would to get her Echo results please. Pt did not understand her results.

## 2018-09-09 ENCOUNTER — Ambulatory Visit (INDEPENDENT_AMBULATORY_CARE_PROVIDER_SITE_OTHER): Payer: BLUE CROSS/BLUE SHIELD | Admitting: Cardiology

## 2018-09-09 ENCOUNTER — Encounter: Payer: Self-pay | Admitting: Cardiology

## 2018-09-09 VITALS — BP 110/70 | HR 89 | Ht 71.75 in | Wt 133.6 lb

## 2018-09-09 DIAGNOSIS — Z01818 Encounter for other preprocedural examination: Secondary | ICD-10-CM | POA: Diagnosis not present

## 2018-09-09 DIAGNOSIS — I341 Nonrheumatic mitral (valve) prolapse: Secondary | ICD-10-CM | POA: Insufficient documentation

## 2018-09-09 DIAGNOSIS — I340 Nonrheumatic mitral (valve) insufficiency: Secondary | ICD-10-CM | POA: Diagnosis not present

## 2018-09-09 NOTE — Patient Instructions (Addendum)
Medication Instructions:  NOT NEEDED If you need a refill on your cardiac medications before your next appointment, please call your pharmacy.   Lab work: LABS   BMP ,CBC FOR PROCEDURE  If you have labs (blood work) drawn today and your tests are completely normal, you will receive your results only by: [name] MyChart Message (if you have MyChart) OR . A paper copy in the mail If you have any lab test that is abnormal or we need to change your treatment, we will call you to review the results.  Testing/Procedures: SCHEDULE AT [facility] AT ENDOSCOPY FEB 07,8675 Your physician has requested that you have a TEE. During a TEE, sound waves are used to create images of your heart. It provides your doctor with information about the size and shape of your heart and how well your heart's chambers and valves are working. In this test, a transducer is attached to the end of a flexible tube that's guided down your throat and into your esophagus (the tube leading from you mouth to your stomach) to get a more detailed image of your heart. You are not awake for the procedure. Please see the instruction sheet given to you today. For further information please visit [email or web address]     Follow-Up: At [facility], you and your health needs are our priority.  As part of our continuing mission to provide you with exceptional heart care, we have created designated Provider Care Teams.  These Care Teams include your primary Cardiologist (physician) and Advanced Practice Providers (APPs -  Physician Assistants and Nurse Practitioners) who all work together to provide you with the care you need, when you need it. You will need a follow up appointment in 3 MONTH.  Please call our office 2 months in advance to schedule this appointment.  You may see Bryan Lemma, MD  or one of the following Advanced Practice Providers on your designated Care Team:   Theodore Demark, PA-C . Joni Reining, DNP, ANP  Any Other  Special Instructions Will Be Listed Below (If Applicable).    Dear  Mrs Andrea Watson are scheduled for a TEE-    on Feb 19,2020 with Dr. Rennis Golden.  Please arrive at the [facility] (Main Entrance A) at [facility]: [address] [place], [place] [address] at 9 am. (1 hour prior to procedure unless lab work is needed; if lab work is needed arrive 1.5 hours ahead)  DIET: Nothing to eat or drink after midnight except a sip of water with medications (see medication instructions below)  Medication Instructions TAKE MEDICATION AFTER PROCEDURE ON FEB 19,2020  Labs: LABS TODAY  BMP , CBC  You must have a responsible person to drive you home and stay in the waiting area during your procedure. Failure to do so could result in cancellation.  Bring your insurance cards.  *Special Note: Every effort is made to have your procedure done on time. Occasionally there are emergencies that occur at the hospital that may cause delays. Please be patient if a delay does occur.

## 2018-09-09 NOTE — Progress Notes (Signed)
PCP: Dorena Bodoixon, Mary B, PA-C  Clinic Note: Chief Complaint  Patient presents with  . Follow-up    Echo results  . Mitral Regurgitation  . Mitral Valve Prolapse    HPI: Andrea Watson is a 38 y.o. female who is being seen today for follow-up of initial evaluation of heart murmur --now clearly noted to be mitral valve prolapse with much regurgitation.  Initially seen at the request of Dorena BodoDixon, Mary B, PA-C.  Andrea Watson is a very pleasant 38 year old woman who is a highly functioning Asperger's patient who presents here today for f/u evaluation of a murmur.  She has never been told that she had a murmur before her most recent evaluation.  She routinely comes in with her mother to assist with history.   - Her initial consult visit was  on August 12, 2018 for evaluation of a murmur.  She clearly had a much regurgitation murmur with a possible midsystolic click.  I referred her for transthoracic echo reviewed below.  She now presents to discuss results and possibly discuss TEE.  Recent Hospitalizations:   None,  Studies Personally Reviewed - (if available, images/films reviewed: From Epic Chart or Care Everywhere)  TTE 08/18/2018: Normal LV systolic function (EF 55-60%); prolapse of anterior MV leaflet; MR not well interrogated but is eccentric, posteriorly directed and at least moderate; suggest TEE to further assess.  Interval History: [name] and her mother return here today to discuss results of her echo.  She remains completely asymptomatic without any resting exertional dyspnea.  No PND, orthopnea or edema.  She has not had any recent fevers chills colds or sweats.  Remainder of cardiac review of symptoms: No PND, orthopnea or edema.  No palpitations, lightheadedness, dizziness, weakness or syncope/near syncope. No TIA/amaurosis fugax symptoms. No claudication.  They did have lots of questions about the echocardiogram results and what it means.  Questions about where to we do GO on from  here.  ROS: A comprehensive was performed. Review of Systems  Constitutional: Negative for chills and fever.  HENT: Negative for nosebleeds.   Respiratory: Negative for cough.        Recovered from her URI.  No further symptoms.  Gastrointestinal: Negative for blood in stool and melena.  Genitourinary: Negative for hematuria.  Musculoskeletal: Back pain: h/o scoliosis Sgx as child.  Neurological: Negative for dizziness, loss of consciousness and weakness.  Psychiatric/Behavioral:       Asperger's - highly functioning  All other systems reviewed and are negative.  I have reviewed and (if needed) personally updated the patient's problem list, medications, allergies, past medical and surgical history, social and family history.   Past Medical History:  Diagnosis Date  . Autism   . History of scoliosis    Repair surgery in childhood  . MVP (mitral valve prolapse) 08/2018   With at least moderate MR    Past Surgical History:  Procedure Laterality Date  . BACK SURGERY     FOR SCOLIOSIS/Spina bifida occulta repair  . TRANSTHORACIC ECHOCARDIOGRAM  08/18/2018   Normal LV systolic function (EF 55-60%); prolapse of anterior MV leaflet; MR not well interrogated but is eccentric, posteriorly directed and at least moderate; suggest TEE to further assess.  . WISDOM TOOTH EXTRACTION      Current Meds  Medication Sig  . b complex vitamins capsule Take 1 capsule by mouth daily.  . drospirenone-ethinyl estradiol (YAZ,GIANVI,LORYNA) 3-0.02 MG tablet Take 1 tablet by mouth daily.  . fexofenadine (ALLEGRA) 30 MG  tablet Take 30 mg by mouth 2 (two) times daily.  . Multiple Vitamin (MULTIVITAMIN) tablet Take 1 tablet by mouth daily.    Allergies  Allergen Reactions  . Latex Rash  . Macrobid [Nitrofurantoin Macrocrystal] Rash  . Tetanus Toxoids Rash    Rash, chills,vomiting    Social History   Tobacco Use  . Smoking status: Never Smoker  . Smokeless tobacco: Never Used  Substance Use  Topics  . Alcohol use: No  . Drug use: No   Social History   Social History Narrative  . Not on file    family history includes Breast cancer in her paternal grandmother; Cancer in her paternal grandmother; Hypertension in her father.  Wt Readings from Last 3 Encounters:  09/09/18 133 lb 9.6 oz (60.6 kg)  08/12/18 137 lb (62.1 kg)  01/01/17 134 lb 6.4 oz (61 kg)    PHYSICAL EXAM BP 110/70   Pulse 89   Ht 5' 11.75" (1.822 m)   Wt 133 lb 9.6 oz (60.6 kg)   SpO2 99%   BMI 18.25 kg/m  Physical Exam  Constitutional: She is oriented to person, place, and time. She appears well-developed and well-nourished. No distress.  Tall, thin (~almost Marfanoid features).  HENT:  Head: Normocephalic and atraumatic.  Neck: Normal range of motion. Neck supple. No hepatojugular reflux and no JVD present. Carotid bruit is not present. No thyroid mass present.  Cardiovascular: Normal rate, regular rhythm and normal pulses.  No extrasystoles are present. PMI is not displaced. Exam reveals a midsystolic click (Cannot exclude). Exam reveals no gallop and no friction rub.  Murmur heard.  Medium-pitched blowing decrescendo holosystolic murmur is present with a grade of 3/6 at the apex. Pulmonary/Chest: Effort normal and breath sounds normal. No respiratory distress. She has no wheezes. She has no rales.  Abdominal: Soft.  Musculoskeletal: Normal range of motion.        General: No edema.  Neurological: She is alert and oriented to person, place, and time.  Psychiatric: She has a normal mood and affect. Her behavior is normal. Judgment and thought content normal.  Vitals reviewed.     Adult ECG Report Not checked  Other studies Reviewed: Additional studies/ records that were reviewed today include:  Recent Labs: None  ASSESSMENT / PLAN: Problem List Items Addressed This Visit    Mitral regurgitation - Primary    Clearly MR and exam.  With a holosystolic murmur.  Confirmed by echocardiogram.   Unfortunately they were not able to fully determine the extent of the MR at least moderate if not severe.  Recommendation was TEE. Plan: TEE.  I did discuss the risk, benefits, alternatives and indications of the procedure in detail.      Relevant Orders   ECHO TEE   CBC (Completed)   Basic metabolic panel (Completed)   MVP (mitral valve prolapse)    Mitral prolapse which did not appear to be rheumatic in nature.  Probably is connective tissue disorder related.  Her mother apparently had some concern for potential connective tissue disorder noted early on in her adulthood.  Plan: TEE to evaluate valve anatomy and extent of MR.  In addition discussing TEE, we also discussed potential steps going forward including referral for mitral valve repair. We also discussed the importance of antibiotic prophylaxis for SBE. -->  Following TEE, we will provide as needed prescription for amoxicillin.      Relevant Orders   ECHO TEE   CBC (Completed)   Basic metabolic  panel (Completed)    Other Visit Diagnoses    Pre-op testing       Relevant Orders   CBC (Completed)   Basic metabolic panel (Completed)      I spent a total of 45-50 minutes with the patient and her mom.. >  50% of the time was spent in direct patient consultation.  We discussed the pathophysiology of much regurgitation and mitral prolapse.  We also discussed transesophageal echocardiogram, the reasons behind it.  For now, they would like to schedule TEE but which she would like to discuss with her husband before going forward.  We will therefore plan for the week after next as opposed to next week.  Current medicines are reviewed at length with the patient today.  (+/- concerns) n/a The following changes have been made:  n/a  Patient Instructions  Medication Instructions:   If you need a refill on your cardiac medications before your next appointment, please call your pharmacy.   Lab work:  If you have labs (blood work)  drawn today and your tests are completely normal, you will receive your results only by: [name] MyChart Message (if you have MyChart) OR . A paper copy in the mail If you have any lab test that is abnormal or we need to change your treatment, we will call you to review the results.  Testing/Procedures: SCHEDULE AT [facility] AT ENDOSCOPY Your physician has requested that you have a TEE.   Follow-Up: At [facility], you and your health needs are our priority.  As part of our continuing mission to provide you with exceptional heart care, we have created designated Provider Care Teams.  These Care Teams include your primary Cardiologist (physician) and Advanced Practice Providers (APPs -  Physician Assistants and Nurse Practitioners) who all work together to provide you with the care you need, when you need it. You will need a follow up appointment in  ~1-2 months       .  Please call our office 2 months in advance to schedule this appointment.  You may see Bryan Lemma, MD  or one of the following Advanced Practice Providers on your designated Care Team:   Theodore Demark, PA-C . Joni Reining, DNP, ANP  Any Other Special Instructions Will Be Listed Below (If Applicable).   Studies Ordered:   Orders Placed This Encounter  Procedures  . CBC  . Basic metabolic panel  . ECHO TEE      Bryan Lemma, M.D., M.S. Interventional Cardiologist   Pager # [phone] Phone # [phone] [address] [place], [place] [address]   Thank you for choosing Heartcare at [facility]!!

## 2018-09-10 LAB — BASIC METABOLIC PANEL
BUN/Creatinine Ratio: 10 (ref 9–23)
BUN: 8 mg/dL (ref 6–20)
CALCIUM: 9.9 mg/dL (ref 8.7–10.2)
CHLORIDE: 102 mmol/L (ref 96–106)
CO2: 22 mmol/L (ref 20–29)
Creatinine, Ser: 0.78 mg/dL (ref 0.57–1.00)
GFR calc Af Amer: 112 mL/min/{1.73_m2} (ref >59)
GFR, EST NON AFRICAN AMERICAN: 97 mL/min/{1.73_m2} (ref >59)
Glucose: 78 mg/dL (ref 65–99)
POTASSIUM: 4.4 mmol/L (ref 3.5–5.2)
Sodium: 139 mmol/L (ref 134–144)

## 2018-09-10 LAB — CBC
HEMATOCRIT: 41.2 % (ref 34.0–46.6)
Hemoglobin: 14.3 g/dL (ref 11.1–15.9)
MCH: 30.3 pg (ref 26.6–33.0)
MCHC: 34.7 g/dL (ref 31.5–35.7)
MCV: 87 fL (ref 79–97)
Platelets: 340 10*3/uL (ref 150–450)
RBC: 4.72 x10E6/uL (ref 3.77–5.28)
RDW: 11.5 % — AB (ref 11.7–15.4)
WBC: 7.2 10*3/uL (ref 3.4–10.8)

## 2018-09-11 ENCOUNTER — Encounter: Payer: Self-pay | Admitting: Cardiology

## 2018-09-11 NOTE — Assessment & Plan Note (Addendum)
Mitral prolapse which did not appear to be rheumatic in nature.  Probably is connective tissue disorder related.  Her mother apparently had some concern for potential connective tissue disorder noted early on in her adulthood.  Plan: TEE to evaluate valve anatomy and extent of MR.  In addition discussing TEE, we also discussed potential steps going forward including referral for mitral valve repair. We also discussed the importance of antibiotic prophylaxis for SBE. -->  Following TEE, we will provide as needed prescription for amoxicillin.

## 2018-09-11 NOTE — Assessment & Plan Note (Signed)
Clearly MR and exam.  With a holosystolic murmur.  Confirmed by echocardiogram.  Unfortunately they were not able to fully determine the extent of the MR at least moderate if not severe.  Recommendation was TEE. Plan: TEE.  I did discuss the risk, benefits, alternatives and indications of the procedure in detail.

## 2018-09-23 ENCOUNTER — Ambulatory Visit (HOSPITAL_BASED_OUTPATIENT_CLINIC_OR_DEPARTMENT_OTHER): Payer: BLUE CROSS/BLUE SHIELD

## 2018-09-23 ENCOUNTER — Other Ambulatory Visit: Payer: Self-pay

## 2018-09-23 ENCOUNTER — Encounter (HOSPITAL_COMMUNITY)
Admission: RE | Disposition: A | Discharge status: Home / Self Care | Payer: Self-pay | Source: Home / Self Care | Attending: Internal Medicine

## 2018-09-23 ENCOUNTER — Ambulatory Visit (HOSPITAL_COMMUNITY)
Admission: RE | Admit: 2018-09-23 | Discharge: 2018-09-23 | Disposition: A | Discharge status: Home / Self Care | Payer: BLUE CROSS/BLUE SHIELD | Attending: Internal Medicine | Admitting: Internal Medicine

## 2018-09-23 ENCOUNTER — Encounter (HOSPITAL_COMMUNITY): Payer: Self-pay | Admitting: *Deleted

## 2018-09-23 DIAGNOSIS — I340 Nonrheumatic mitral (valve) insufficiency: Secondary | ICD-10-CM

## 2018-09-23 DIAGNOSIS — Z01818 Encounter for other preprocedural examination: Secondary | ICD-10-CM

## 2018-09-23 DIAGNOSIS — I341 Nonrheumatic mitral (valve) prolapse: Secondary | ICD-10-CM | POA: Insufficient documentation

## 2018-09-23 HISTORY — PX: TEE WITHOUT CARDIOVERSION: SHX5443

## 2018-09-23 SURGERY — ECHOCARDIOGRAM, TRANSESOPHAGEAL
Anesthesia: Moderate Sedation

## 2018-09-23 MED ORDER — MIDAZOLAM HCL (PF) 5 MG/ML IJ SOLN
INTRAMUSCULAR | Status: AC
  Filled 2018-09-23: qty 2

## 2018-09-23 MED ORDER — FENTANYL CITRATE (PF) 100 MCG/2ML IJ SOLN
INTRAMUSCULAR | Status: AC
  Filled 2018-09-23: qty 2

## 2018-09-23 MED ORDER — LIDOCAINE VISCOUS HCL 2 % MT SOLN
OROMUCOSAL | Status: DC | PRN
  Administered 2018-09-23: 5 mL via OROMUCOSAL

## 2018-09-23 MED ORDER — MIDAZOLAM HCL (PF) 10 MG/2ML IJ SOLN
INTRAMUSCULAR | Status: DC | PRN
  Administered 2018-09-23 (×2): 2 mg via INTRAVENOUS
  Administered 2018-09-23: 1 mg via INTRAVENOUS

## 2018-09-23 MED ORDER — FENTANYL CITRATE (PF) 100 MCG/2ML IJ SOLN
INTRAMUSCULAR | Status: DC | PRN
  Administered 2018-09-23 (×3): 25 ug via INTRAVENOUS

## 2018-09-23 MED ORDER — SODIUM CHLORIDE 0.9 % IV SOLN: INTRAVENOUS | Status: DC

## 2018-09-23 MED ORDER — LIDOCAINE VISCOUS HCL 2 % MT SOLN
OROMUCOSAL | Status: AC
  Filled 2018-09-23: qty 15

## 2018-09-23 NOTE — H&P (Signed)
   INTERVAL PROCEDURE H&P  History and Physical Interval Note:  09/23/2018 9:03 AM  Andrea Watson has presented today for their planned procedure. The various methods of treatment have been discussed with the patient and family. After consideration of risks, benefits and other options for treatment, the patient has consented to the procedure.  The patients' outpatient history has been reviewed, patient examined, and no change in status from most recent office note within the past 30 days. I have reviewed the patients' chart and labs and will proceed as planned. Questions were answered to the patient's satisfaction.   Andrea Nose, MD, [facility], FACP  [place]  [facility]  Medical Director of the [facility]id [facility] &  Cardiovascular Risk Reduction Clinic Diplomate of the American Board of Clinical Lipidology Attending Cardiologist  Direct Dial: [phone]  Fax: [phone]  Website:  [email or web address] Andrea Watson 09/23/2018, 9:03 AM

## 2018-09-23 NOTE — Progress Notes (Signed)
Echocardiogram Transesophageal has been performed.  Andrea Watson 09/23/2018, 10:53 AM

## 2018-09-23 NOTE — CV Procedure (Signed)
TRANSESOPHAGEAL ECHOCARDIOGRAM (TEE) NOTE  INDICATIONS: mitral valve prolapse  PROCEDURE:   Informed consent was obtained prior to the procedure. The risks, benefits and alternatives for the procedure were discussed and the patient comprehended these risks.  Risks include, but are not limited to, cough, sore throat, vomiting, nausea, somnolence, esophageal and stomach trauma or perforation, bleeding, low blood pressure, aspiration, pneumonia, infection, trauma to the teeth and death.    After a procedural time-out, the patient was given  mg versed and 75 mcg fentanyl for moderate sedation.  The patient's heart rate, blood pressure, and oxygen saturation are monitored continuously during the procedure.The oropharynx was anesthetized 10 cc of topical 1% viscous lidocaine and 2 topical cetacaine sprays.  The transesophageal probe was inserted in the esophagus and stomach without difficulty and multiple views were obtained.  The patient was kept under observation until the patient left the procedure room.  The period of conscious sedation is 23 minutes, of which I was present face-to-face 100% of this time. The patient left the procedure room in stable condition.   Agitated microbubble saline contrast was administered.  COMPLICATIONS:    There were no immediate complications.  Findings:  1. LEFT VENTRICLE: The left ventricular wall thickness is normal.  The left ventricular cavity is normal in size. Wall motion is normal.  LVEF is 60-65%.  2. RIGHT VENTRICLE:  The right ventricle is normal in structure and function without any thrombus or masses.    3. LEFT ATRIUM:  The left atrium is normal in size without any thrombus or masses.  There is not spontaneous echo contrast ("smoke") in the left atrium consistent with a low flow state.  4. LEFT ATRIAL APPENDAGE:  The left atrial appendage is free of any thrombus or masses. The appendage has single lobes. Pulse doppler indicates high flow in the  appendage.  5. ATRIAL SEPTUM:  The atrial septum appears intact and is free of thrombus and/or masses.  There is no evidence for interatrial shunting by color doppler and saline microbubble.  6. RIGHT ATRIUM:  The right atrium is normal in size and function without any thrombus or masses.  7. MITRAL VALVE:  The mitral valve is abnormal, noting elongation of the anterior leaflet with a more diminutive posterior leaflet. Visualized well in 2D/3D modes. There is holosystolic prolapse and billowing of the anterior leaflet with  moderate to severe, posterior directed regurgitation.  There were no vegetations or stenosis.  8. AORTIC VALVE:  The aortic valve is trileaflet, normal in structure and function with no regurgitation.  There were no vegetations or stenosis  9. TRICUSPID VALVE:  The tricuspid valve is normal in structure and function with no regurgitation.  There were no vegetations or stenosis  10.  PULMONIC VALVE:  The pulmonic valve is normal in structure and function with no regurgitation.  There were no vegetations or stenosis.   11. AORTIC ARCH, ASCENDING AND DESCENDING AORTA:  There was no Myrtis Ser et. Al, 1992) atherosclerosis of the ascending aorta, aortic arch, or proximal descending aorta.  12. PULMONARY VEINS: Anomalous pulmonary venous return was not noted.  13. PERICARDIUM: The pericardium appeared normal and non-thickened.  There is no pericardial effusion.  IMPRESSION:   1. Moderate to severe posteriorly directed, eccentric MR 2. 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 No LAA thrombus 4. Negative for PFO 5. Normal LA size 6. LVEF 60-65% with normal wall motion  RECOMMENDATIONS:    1.  Would  recommend monitoring MR in the absence of symptoms and normal LA size.  Time Spent Directly with the Patient:  60 minutes   Chrystie Nose, MD, [facility], FACP  [place]  [facility]  Medical Director of the  Advanced Lipid Disorders &  Cardiovascular Risk Reduction Clinic Diplomate of the American Board of Clinical Lipidology Attending Cardiologist  Direct Dial: [phone]  Fax: [phone]  Website:  [email or web address] 09/23/2018, 10:45 AM

## 2018-09-23 NOTE — Discharge Instructions (Signed)

## 2018-10-07 ENCOUNTER — Telehealth: Payer: Self-pay | Admitting: *Deleted

## 2018-10-07 NOTE — Telephone Encounter (Signed)
-----   Message from Marykay Lex, MD sent at 09/26/2018 11:51 PM EST ----- So the TEE confirmed but with new as having significant mitral valve regurgitation.  There is moderate to severe mitral regurgitation with moderate mitral valve prolapse.  Thankfully, the pump function is well-preserved at 60 to 65%, the left ventricle size is normal as is the left atrial size. We will continue to monitor this with transthoracic echocardiograms  --we will check at least one next year and then see how much progression is been.  Determine for that time if we check every year or every other year.  Recommendations are to monitor for symptoms of shortness of breath with exertion or less short of breath laying down flat.  Also for abnormal heart rhythms such as rapid irregular heartbeats that last for long time.  Any feeling of lightheaded and dizzy like he cannot pass out.  As far as other management goes, current guidelines actually do not recommend taking antibiotics prior to GI or dental procedures in the absence of actually having surgery on the valve.  We can discuss this more in follow-up.  Bryan Lemma, MD

## 2018-10-07 NOTE — Telephone Encounter (Signed)
LEFT MESSAGE TO CALL BACK ABOUT  RESULTS -NEEDS FOLLOW UP APPOINTMENT.  ( PATIENT DOES NOT NEED SOON  IT CAN BE IN SEVERAL MONTHS IF SHE LIKES PER DR HARDING)

## 2018-11-19 ENCOUNTER — Telehealth: Payer: Self-pay | Admitting: *Deleted

## 2018-11-19 NOTE — Telephone Encounter (Signed)
   Primary Cardiologist:  Bryan Lemma, MD   Patient contacted.  History reviewed.  No symptoms to suggest any unstable cardiac conditions.  Based on discussion, with current pandemic situation, we will be postponing this appointment for Andrea Watson with a plan for f/u in SEPT 22,2020 AT 1:40 PM or sooner if feasible/necessary.  If symptoms change, she has been instructed to contact our office.     Tobin Chad, RN  11/19/2018 11:33 AM         .

## 2018-11-24 ENCOUNTER — Ambulatory Visit: Payer: BLUE CROSS/BLUE SHIELD | Admitting: Cardiology

## 2019-04-27 ENCOUNTER — Telehealth (INDEPENDENT_AMBULATORY_CARE_PROVIDER_SITE_OTHER): Payer: BC Managed Care – PPO | Admitting: Cardiology

## 2019-04-27 ENCOUNTER — Ambulatory Visit: Payer: BLUE CROSS/BLUE SHIELD | Admitting: Cardiology

## 2019-04-27 ENCOUNTER — Encounter: Payer: Self-pay | Admitting: Cardiology

## 2019-04-27 ENCOUNTER — Telehealth: Payer: Self-pay | Admitting: Cardiology

## 2019-04-27 ENCOUNTER — Telehealth: Payer: Self-pay | Admitting: *Deleted

## 2019-04-27 VITALS — BP 120/82 | HR 100 | Ht 71.75 in | Wt 135.0 lb

## 2019-04-27 DIAGNOSIS — I341 Nonrheumatic mitral (valve) prolapse: Secondary | ICD-10-CM | POA: Diagnosis not present

## 2019-04-27 DIAGNOSIS — I340 Nonrheumatic mitral (valve) insufficiency: Secondary | ICD-10-CM

## 2019-04-27 NOTE — Telephone Encounter (Signed)
Called, LVM to call back to discuss.

## 2019-04-27 NOTE — Assessment & Plan Note (Signed)
Appears to be nonrheumatic mitral valve prolapse with moderate to severe MR. We can reassess with echocardiogram here in November or December timeframe while monitoring for progression of disease in the valve.

## 2019-04-27 NOTE — Patient Instructions (Addendum)
Medication Instructions:   none  If you need a refill on your cardiac medications before your next appointment, please call your pharmacy.   Lab work: n/a    Testing/Procedures:  2D Echo - to be checked ~ November 2020 Will be schedule  at [address] Your physician has requested that you have an echocardiogram. Echocardiography is a painless test that uses sound waves to create images of your heart. It provides your doctor with information about the size and shape of your heart and how well your heart's chambers and valves are working. This procedure takes approximately one hour. There are no restrictions for this procedure.   Follow-Up: At [facility], you and your health needs are our priority.  As part of our continuing mission to provide you with exceptional heart care, we have created designated Provider Care Teams.  These Care Teams include your primary Cardiologist (physician) and Advanced Practice Providers (APPs -  Physician Assistants and Nurse Practitioners) who all work together to provide you with the care you need, when you need it. You will need a follow up appointment in  5 months (Feb 2021) with Dr Ellyn Hack .  Please call our office 2 months in advance to schedule this appointment.      Any Other Special Instructions Will Be Listed Below (If Applicable).  SYMPTOMS TO LOOK FOR:   Worsening shortness of breath with exertion, exercise  Short of breath lying down flat or waking up short of breath  Swelling in your legs that is new  Fast, irregular heartbeats that make you feel lightheaded or dizzy

## 2019-04-27 NOTE — Telephone Encounter (Signed)
Spoke to patient .  Patient will have a virtual appointment today - to discuss results -  Patient states mother will be at her home today - will use  email address- too conduct virtual visit

## 2019-04-27 NOTE — Progress Notes (Signed)
Virtual Visit via Video Note   This visit type was conducted due to national recommendations for restrictions regarding the COVID-19 Pandemic (e.g. social distancing) in an effort to limit this patient's exposure and mitigate transmission in our community.  Due to her co-morbid illnesses, this patient is at least at moderate risk for complications without adequate follow up.  This format is felt to be most appropriate for this patient at this time.  All issues noted in this document were discussed and addressed.  A limited physical exam was performed with this format.  Please refer to the patient's chart for her consent to telehealth for [name].   Patient has given verbal permission to conduct this visit via virtual appointment and to bill insurance 04/27/2019 2:30 PM     Evaluation Performed:  Follow-up visit  Date:  04/27/2019   ID:  Andrea Watson, DOB [birth year], MRN [id number]  Patient Location: Home Provider Location: Home  PCP:  Dorena Bodo, PA-C  Cardiologist:  Bryan Lemma, MD  Electrophysiologist:  None   Chief Complaint: 6-28-month follow-up for MR  History of Present Illness:    Andrea Watson is a 38 y.o. female with PMH notable for high functioning Asperger's referred for evaluation of heart murmur & found to have mitral regurgitation who presents via audio/video conferencing for a telehealth visit today.  Andrea Watson was last seen on September 09, 2018 in follow-up from an echocardiogram done in response to initial consultation on August 12, 2018.  This showed much regurgitation and recommended TEE for better evaluation.  Prior CV studies:   The following studies were reviewed today:  TEE September 23, 2018: EF 60-65%.  Normal function.  Normal LV size.  Normal RV.  Moderate-severe mitral regurgitation with jet directed posterior.  Moderate elongation of anterior mitral annular leaflet (a.m. VL).  Moderate MVP.   Interval History:    Amayia seems to be  relatively stable overall from a cardiac standpoint.  She has a few odd symptoms that she notes including a little bit of more cold intolerance at night.  She does get a little dizzy and short of breath during the initial stages in the latter stages of her menstrual cycle.  She is not all that active as far as exercise goes so is difficult to tell any exercise tolerance.  She thinks that maybe she is not doing as much she did a couple years ago but that entails instead of being able to exercise for an hour at a time she stops at about half an hour.  Still relatively asymptomatic.  Cardiovascular ROS: no chest pain or dyspnea on exertion positive for - Cold intolerance, mildly reduced exercise tolerance. negative for - edema, irregular heartbeat, orthopnea, palpitations, paroxysmal nocturnal dyspnea, rapid heart rate, shortness of breath or Syncope/near syncope, TIA/amaurosis fugax  The patient does not have symptoms concerning for COVID-19 infection (fever, chills, cough, or new shortness of breath).  The patient is practicing social distancing.  ROS:    Review of Systems  Constitutional: Negative for malaise/fatigue and weight loss.  Respiratory: Negative for shortness of breath.   Musculoskeletal: Negative for joint pain.  Neurological: Negative for dizziness.  Psychiatric/Behavioral: Negative.     Past Medical History:  Diagnosis Date  . Autism   . History of scoliosis    Repair surgery in childhood  . MVP (mitral valve prolapse) 08/2018   With at least moderate MR   Past Surgical History:  Procedure Laterality Date  .  BACK SURGERY     FOR SCOLIOSIS/Spina bifida occulta repair  . TEE WITHOUT CARDIOVERSION N/A 09/23/2018   Procedure: TRANSESOPHAGEAL ECHOCARDIOGRAM (TEE);  Surgeon: Chrystie Nose, MD;  Location: [facility] ENDOSCOPY;  Service: Cardiovascular;  Laterality: N/A;  . TRANSTHORACIC ECHOCARDIOGRAM  08/18/2018   Normal LV systolic function (EF 55-60%); prolapse of anterior MV  leaflet; MR not well interrogated but is eccentric, posteriorly directed and at least moderate; suggest TEE to further assess.  . WISDOM TOOTH EXTRACTION       Current Meds  Medication Sig  . b complex vitamins capsule Take 1 capsule by mouth daily.  . Cholecalciferol (VITAMIN D) 50 MCG (2000 UT) CAPS Take 2,000 Units by mouth daily.  . drospirenone-ethinyl estradiol (YASMIN,ZARAH,SYEDA) 3-0.03 MG tablet Take 1 tablet by mouth daily.   . Loratadine 10 MG CAPS Take by mouth daily as needed.  . Multiple Vitamin (MULTIVITAMIN) tablet Take 1 tablet by mouth daily.     Allergies:   Latex, Macrobid [nitrofurantoin macrocrystal], and Tetanus toxoids   Social History   Tobacco Use  . Smoking status: Never Smoker  . Smokeless tobacco: Never Used  Substance Use Topics  . Alcohol use: No  . Drug use: No     Family Hx: The patient's family history includes Breast cancer in her paternal grandmother; Cancer in her paternal grandmother; Hypertension in her father.   Labs/Other Tests and Data Reviewed:    EKG:  No ECG reviewed.  Recent Labs: 09/09/2018: BUN 8; Creatinine, Ser 0.78; Hemoglobin 14.3; Platelets 340; Potassium 4.4; Sodium 139   Recent Lipid Panel Lab Results  Component Value Date/Time   CHOL 186 01/01/2017 11:05 AM   TRIG 130 01/01/2017 11:05 AM   HDL 83 01/01/2017 11:05 AM   CHOLHDL 2.2 01/01/2017 11:05 AM   LDLCALC 77 01/01/2017 11:05 AM    Wt Readings from Last 3 Encounters:  04/27/19 135 lb (61.2 kg)  09/09/18 133 lb 9.6 oz (60.6 kg)  08/12/18 137 lb (62.1 kg)     Objective:    Vital Signs:  BP 120/82 (BP Location: Right Arm)   Pulse 100   Ht 5' 11.75" (1.822 m)   Wt 135 lb (61.2 kg)   BMI 18.44 kg/m   VITAL SIGNS:  reviewed GEN:  Well nourished, well developed female in no acute distress. RESPIRATORY:  normal respiratory effort, symmetric expansion CARDIOVASCULAR:  no peripheral edema MUSCULOSKELETAL:  no obvious deformities. NEURO:  alert and oriented  x 3, no obvious focal deficit PSYCH:  Somewhat blunted affect but normal mood.  Not very talkative   ASSESSMENT & PLAN:    Problem List Items Addressed This Visit    Mitral regurgitation    New moderate to severe MR by TEE with preserved EF of 60 to 65% by echo.  She is not very active which makes evaluation of symptoms of difficult.  Also she is a pretty poor historian. Probably class I with maybe a little hint of class II CHF symptoms noting that she is having some decrease in exercise ability from about an hour to about half an hour.  Plan: With minimal to mild symptoms, watchful waiting is probably the best option.  We discussed concerning symptoms.  At this point without having onset of symptoms would prefer monitoring every 6 months or so.  Recommendation will be follow-up echo in 6 to 79months which splitting the difference at 9 months would be November timeframe.  We will recheck a 2D echo in roughly November.  Worst  her symptoms to worsen in the interim, we would be happy to proceed with setting up surgical evaluation for valve repair.  If not, will hold off until after follow-up echo.  If there are notable differences with dilation of the left ventricle or left atrium either in conjunction with or separately from reduced EF, I would have low threshold to refer her for surgery.  We will need to be little more detailed in discussing cardiac catheterization (right and left) when if the time comes.      MVP (mitral valve prolapse)    Appears to be nonrheumatic mitral valve prolapse with moderate to severe MR. We can reassess with echocardiogram here in November or December timeframe while monitoring for progression of disease in the valve.         --Current guidelines on SBE prophylaxis are little bit unclear with the extent of mitral regurgitation she has.  Regardless, were the valve to get worse, she would most likely require more aggressive surgical evaluation.  In which case she  would be started on prophylactic antibiotics sooner.  Monitor for now, but if there are any other short surgical or cardiac issues, would consider evaluation.  COVID-19 Education: The signs and symptoms of COVID-19 were discussed with the patient and how to seek care for testing (follow up with PCP or arrange E-visit).   The importance of social distancing was discussed today.  Time:   Today, I have spent 22 minutes with the patient with telehealth technology discussing the above problems.     Patient Instructions  Medication Instructions:   none  If you need a refill on your cardiac medications before your next appointment, please call your pharmacy.   Lab work: n/a  If you have labs (blood work) drawn today and your tests are completely normal, you will receive your results only by: [name] MyChart Message (if you have MyChart) OR . A paper copy in the mail If you have any lab test that is abnormal or we need to change your treatment, we will call you to review the results.  Testing/Procedures:  2D Echo - to be checked ~ November  Follow-Up: At [facility], you and your health needs are our priority.  As part of our continuing mission to provide you with exceptional heart care, we have created designated Provider Care Teams.  These Care Teams include your primary Cardiologist (physician) and Advanced Practice Providers (APPs -  Physician Assistants and Nurse Practitioners) who all work together to provide you with the care you need, when you need it. . You will need a follow up appointment in    5       months.  Please call our office 2 months in advance to schedule this appointment.  You may see Glenetta Hew, MD or one of the following Advanced Practice Providers on your designated Care Team:   . Rosaria Ferries, PA-C . Jory Sims, DNP, ANP  Any Other Special Instructions Will Be Listed Below (If Applicable).  SYMPTOMS TO LOOK FOR:   Worsening shortness of breath with  exertion, exercise  Short of breath lying down flat or waking up short of breath  Swelling in your legs that is new  Fast, irregular heartbeats that make you feel lightheaded or dizzy     Signed, Glenetta Hew, MD  04/27/2019 2:30 PM    [place]

## 2019-04-27 NOTE — Telephone Encounter (Signed)
  Patient is calling to get the results of her echo from March

## 2019-04-27 NOTE — Telephone Encounter (Signed)
SPOKE TO PATIENT. INSTRUCTION WAS GIVEN FROM TODAY'S VIRTUAL VISIT.  AVS SUMMARY IS MAILED .  PATIENT VERBALIZED UNDERSTANDING. 

## 2019-04-27 NOTE — Addendum Note (Signed)
Addended by: Raiford Simmonds on: 04/27/2019 02:41 PM   Modules accepted: Orders

## 2019-04-27 NOTE — Assessment & Plan Note (Signed)
New moderate to severe MR by TEE with preserved EF of 60 to 65% by echo.  She is not very active which makes evaluation of symptoms of difficult.  Also she is a pretty poor historian. Probably class I with maybe a little hint of class II CHF symptoms noting that she is having some decrease in exercise ability from about an hour to about half an hour.  Plan: With minimal to mild symptoms, watchful waiting is probably the best option.  We discussed concerning symptoms.  At this point without having onset of symptoms would prefer monitoring every 6 months or so.  Recommendation will be follow-up echo in 6 to 53months which splitting the difference at 9 months would be November timeframe.  We will recheck a 2D echo in roughly November.  Worst her symptoms to worsen in the interim, we would be happy to proceed with setting up surgical evaluation for valve repair.  If not, will hold off until after follow-up echo.  If there are notable differences with dilation of the left ventricle or left atrium either in conjunction with or separately from reduced EF, I would have low threshold to refer her for surgery.  We will need to be little more detailed in discussing cardiac catheterization (right and left) when if the time comes.

## 2019-06-06 HISTORY — PX: TRANSTHORACIC ECHOCARDIOGRAM: SHX275

## 2019-06-22 ENCOUNTER — Ambulatory Visit: Payer: BC Managed Care – PPO | Admitting: Cardiology

## 2019-06-24 ENCOUNTER — Other Ambulatory Visit: Payer: Self-pay

## 2019-06-24 ENCOUNTER — Ambulatory Visit (HOSPITAL_COMMUNITY): Payer: BC Managed Care – PPO | Attending: Cardiology

## 2019-06-24 DIAGNOSIS — I340 Nonrheumatic mitral (valve) insufficiency: Secondary | ICD-10-CM

## 2019-06-24 DIAGNOSIS — I341 Nonrheumatic mitral (valve) prolapse: Secondary | ICD-10-CM | POA: Diagnosis not present

## 2019-07-13 ENCOUNTER — Telehealth: Payer: Self-pay | Admitting: Cardiology

## 2019-07-13 NOTE — Telephone Encounter (Signed)
New Message ° ° ° °Patient is calling to obtain echocardiogram results. Please call to discuss.  °

## 2019-07-13 NOTE — Telephone Encounter (Signed)
Left message -  A correction in upcoming appointment  On feb 16,2021 at 4 pm - will be an in office visit not virtual visit.  any question may call back    The feb 16 ,2021 appointment will be a in office visit.

## 2019-07-13 NOTE — Telephone Encounter (Signed)
Pt aware of echo results and f/u appt made in February Per Dr Ellyn Hack .Adonis Housekeeper

## 2019-08-25 DIAGNOSIS — Z3040 Encounter for surveillance of contraceptives, unspecified: Secondary | ICD-10-CM | POA: Diagnosis not present

## 2019-08-25 DIAGNOSIS — Z8349 Family history of other endocrine, nutritional and metabolic diseases: Secondary | ICD-10-CM | POA: Diagnosis not present

## 2019-08-25 DIAGNOSIS — Z681 Body mass index (BMI) 19 or less, adult: Secondary | ICD-10-CM | POA: Diagnosis not present

## 2019-08-25 DIAGNOSIS — Z01419 Encounter for gynecological examination (general) (routine) without abnormal findings: Secondary | ICD-10-CM | POA: Diagnosis not present

## 2019-09-21 ENCOUNTER — Ambulatory Visit: Payer: BC Managed Care – PPO | Admitting: Cardiology

## 2019-09-21 ENCOUNTER — Telehealth: Payer: BC Managed Care – PPO | Admitting: Cardiology

## 2019-10-06 ENCOUNTER — Ambulatory Visit (INDEPENDENT_AMBULATORY_CARE_PROVIDER_SITE_OTHER): Payer: BC Managed Care – PPO | Admitting: Cardiology

## 2019-10-06 ENCOUNTER — Other Ambulatory Visit: Payer: Self-pay

## 2019-10-06 ENCOUNTER — Encounter: Payer: Self-pay | Admitting: Cardiology

## 2019-10-06 VITALS — BP 122/84 | HR 85 | Ht 72.0 in | Wt 144.8 lb

## 2019-10-06 DIAGNOSIS — I341 Nonrheumatic mitral (valve) prolapse: Secondary | ICD-10-CM

## 2019-10-06 DIAGNOSIS — I340 Nonrheumatic mitral (valve) insufficiency: Secondary | ICD-10-CM | POA: Diagnosis not present

## 2019-10-06 NOTE — Progress Notes (Signed)
Primary Care Provider: Patient, No Pcp Per Cardiologist: Bryan Lemma, MD Electrophysiologist: None  Clinic Note: Chief Complaint  Patient presents with  . Follow-up    Discussed echo results  . Mitral Valve Prolapse    With severe MR    HPI:    Andrea Watson is a 39 y.o. female with a PMH notable for high functioning Asperger's and moderate mitral prolapse with severe mitral regurgitation who presents today for 28-month plus follow-up.  Andrea Watson was last seen on April 27, 2019 via telemedicine in follow-up from TEE last spring to discuss upcoming echocardiogram.  Recent Hospitalizations: None  Reviewed  CV studies:    The following studies were reviewed today: (if available, images/films reviewed: From Epic Chart or Care Everywhere) . TTE 06/2019: Normal LV size and function.  EF 60 to 65%.  Normal RV size and function.  Normal atrial sizes.  Moderate MVP (prolapse of anterior leaflet) with Severe Mitral Valve Regurgitation (posteriorly directed) and no stenosis.  Mild TR.  Normal aortic valve.  No LV dilation-either systolic or diastolic.   Interval History:   Andrea Watson returns here today with her mother pretty much totally asymptomatic.  She is not very active, but does what she wants to do without any major issues.  No rapid regular heartbeats or palpitations.  No syncope or near syncope.  No exertional dyspnea or chest pain.  She somewhat seems incredulous about all the interest placed on her, and is not sure what is all about.  CV Review of Symptoms (Summary) Cardiovascular ROS: no chest pain or dyspnea on exertion negative for - edema, irregular heartbeat, orthopnea, palpitations, paroxysmal nocturnal dyspnea, rapid heart rate, shortness of breath or Syncope/near syncope, TIA shows amaurosis fugax, claudication  The patient does not have symptoms concerning for COVID-19 infection (fever, chills, cough, or new shortness of breath).  The patient is practicing  social distancing & Masking.    REVIEWED OF SYSTEMS   Review of Systems  Constitutional: Negative for malaise/fatigue and weight loss.  HENT: Negative for nosebleeds.   Gastrointestinal: Negative for blood in stool and melena.  Genitourinary: Negative for hematuria.  Musculoskeletal: Positive for joint pain (Off and on).  Neurological: Positive for headaches. Negative for focal weakness.  Psychiatric/Behavioral: Negative.     I have reviewed and (if needed) personally updated the patient's problem list, medications, allergies, past medical and surgical history, social and family history.   PAST MEDICAL HISTORY   Past Medical History:  Diagnosis Date  . Autism   . History of scoliosis    Repair surgery in childhood  . MVP (mitral valve prolapse) 08/2018   With at least moderate MR    PAST SURGICAL HISTORY   Past Surgical History:  Procedure Laterality Date  . BACK SURGERY     FOR SCOLIOSIS/Spina bifida occulta repair  . TEE WITHOUT CARDIOVERSION N/A 09/23/2018   Procedure: TRANSESOPHAGEAL ECHOCARDIOGRAM (TEE);  Surgeon: Chrystie Nose, MD;  Location: [facility] ENDOSCOPY;  Service: Cardiovascular;  Laterality: N/A;  . TRANSTHORACIC ECHOCARDIOGRAM  08/18/2018   Normal LV systolic function (EF 55-60%); prolapse of anterior MV leaflet; MR not well interrogated but is eccentric, posteriorly directed and at least moderate; suggest TEE to further assess.  . TRANSTHORACIC ECHOCARDIOGRAM  06/2019   Normal LV size and function.  EF 60 to 65%.  Normal RV size and function.  Normal atrial sizes.  Moderate MVP (prolapse of anterior leaflet) with Severe Mitral Valve Regurgitation (posteriorly directed) and no stenosis.  Mild TR.  Normal aortic valve.  No LV dilation-either systolic or diastolic.  . WISDOM TOOTH EXTRACTION       MEDICATIONS/ALLERGIES   Current Meds  Medication Sig  . b complex vitamins capsule Take 1 capsule by mouth daily.  . Cholecalciferol (VITAMIN D) 50 MCG (2000 UT)  CAPS Take 2,000 Units by mouth daily.  . drospirenone-ethinyl estradiol (YASMIN,ZARAH,SYEDA) 3-0.03 MG tablet Take 1 tablet by mouth daily.   . Loratadine 10 MG CAPS Take by mouth daily as needed.  . Multiple Vitamin (MULTIVITAMIN) tablet Take 1 tablet by mouth daily.    Allergies  Allergen Reactions  . Other Other (See Comments), Rash, Nausea Only, Diarrhea and Nausea And Vomiting  . Molds & Smuts Cough  . Latex Rash  . Macrobid [Nitrofurantoin Macrocrystal] Rash  . Tetanus Toxoids Nausea And Vomiting and Rash    Chills    SOCIAL HISTORY/FAMILY HISTORY   Reviewed in Epic:  Pertinent findings: No new findings  OBJCTIVE -PE, EKG, labs   Wt Readings from Last 3 Encounters:  10/06/19 144 lb 12.8 oz (65.7 kg)  04/27/19 135 lb (61.2 kg)  09/09/18 133 lb 9.6 oz (60.6 kg)    Physical Exam: BP 122/84   Pulse 85   Ht 6' (1.829 m)   Wt 144 lb 12.8 oz (65.7 kg)   BMI 19.64 kg/m  Physical Exam  Constitutional: She is oriented to person, place, and time. She appears well-developed and well-nourished. No distress.  Well-groomed.  Healthy-appearing.  HENT:  Head: Normocephalic and atraumatic.  Neck: No hepatojugular reflux and no JVD present. Carotid bruit is not present.  Cardiovascular: Normal rate, regular rhythm, S2 normal and intact distal pulses.  Occasional extrasystoles are present. PMI is not displaced. Exam reveals a midsystolic click. Exam reveals no gallop and no friction rub.  Murmur (3/6 HSM at apex) heard. Soft S1  Pulmonary/Chest: Effort normal and breath sounds normal. No respiratory distress. She has no wheezes. She has no rales.  Musculoskeletal:        General: No edema. Normal range of motion.     Cervical back: Normal range of motion and neck supple.  Neurological: She is alert and oriented to person, place, and time.  Psychiatric: Thought content normal.  Somewhat blunted affect.  Seems shy still.  Slow to answer questions.  Defers a lot to her mother.    Vitals reviewed.   Adult ECG Report  Rate: 85 ;  Rhythm: normal sinus rhythm and Biatrial abnormality, incomplete right bundle mesh block.  Right axis deviation (99 degrees);   Narrative Interpretation: Stable EKG.  Recent Labs:  Lab Results  Component Value Date   CHOL 186 01/01/2017   HDL 83 01/01/2017   LDLCALC 77 01/01/2017   TRIG 130 01/01/2017   CHOLHDL 2.2 01/01/2017   Lab Results  Component Value Date   CREATININE 0.78 09/09/2018   BUN 8 09/09/2018   NA 139 09/09/2018   K 4.4 09/09/2018   CL 102 09/09/2018   CO2 22 09/09/2018   Lab Results  Component Value Date   TSH 1.25 01/01/2017    ASSESSMENT/PLAN    Problem List Items Addressed This Visit    Severe mitral regurgitation by prior echocardiography (Chronic)    Now somewhat chronic severe MR with no symptoms.  Normal EF on echo still greater than 60%.  Normal LV size both in systole and end diastole.  Class I CHF symptoms indicating asymptomatic MR. Plan: Continue to follow echoes every 6 to  12 months per guidelines.  With normal blood pressure, would not treat unless she has signs or symptoms of arrhythmias.  We discussed concerning symptoms not only heart failure also arrhythmias.      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  Continue to follow for now for symptoms and changes in LV function or dilation..  Routine echoes.      Relevant Orders   EKG 12-Lead (Completed)   ECHOCARDIOGRAM COMPLETE       COVID-19 Education: The signs and symptoms of COVID-19 were discussed with the patient and how to seek care for testing (follow up with PCP or arrange E-visit).   The importance of social distancing was discussed today.  I spent a total of 72minutes with the patient. >  50% of the time was spent in direct patient consultation.  Additional time  spent with chart review  / charting (studies, outside notes, etc): 8 Total Time: 26 min   Current medicines are reviewed at length with the patient today.  (+/- concerns) none   Patient Instructions / Medication Changes & Studies & Tests Ordered   Patient Instructions  Medication Instructions:  Not needed If you need a refill on your cardiac medications before your next appointment, please call your pharmacy*   Lab Work: Not needed If you have labs (blood work) drawn today and your tests are completely normal, you will receive your results only by: [name] MyChart Message (if you have MyChart) OR . A paper copy in the mail If you have any lab test that is abnormal or we need to change your treatment, we will call you to review the results.   Testing/Procedures: WILL BE SCHEDULE AT [place] 2021 Your physician has requested that you have an echocardiogram. Echocardiography is a painless test that uses sound waves to create images of your heart. It provides your doctor with information about the size and shape of your heart and how well your heart's chambers and valves are working. This procedure takes approximately one hour. There are no restrictions for this procedure.     Follow-Up: At [facility], you and your health needs are our priority.  As part of our continuing mission to provide you with exceptional heart care, we have created designated Provider Care Teams.  These Care Teams include your primary Cardiologist (physician) and Advanced Practice Providers (APPs -  Physician Assistants and Nurse Practitioners) who all work together to provide you with the care you need, when you need it.  We recommend signing up for the patient portal called "MyChart".  Sign up information is provided on this After Visit Summary.  MyChart is used to connect with patients for Virtual Visits (Telemedicine).  Patients are able to view lab/test results, encounter notes,  upcoming appointments, etc.  Non-urgent messages can be sent to your provider as well.   To learn more about what you can do with MyChart, go to [email or web address].    Your next appointment:   8 month(s) Nov 2021  The format for your next appointment:   In Person  Provider:   Glenetta Hew, MD   Other Instructions n/a    Studies Ordered:   Orders Placed This Encounter  Procedures  . EKG 12-Lead  . ECHOCARDIOGRAM COMPLETE     Glenetta Hew, M.D., M.S. Interventional Cardiologist  Pager # [phone] Phone # [phone] [address] [place], [place] [address]   Thank you for choosing Heartcare at [facility]!!

## 2019-10-06 NOTE — Patient Instructions (Signed)
Medication Instructions:  Not needed If you need a refill on your cardiac medications before your next appointment, please call your pharmacy*   Lab Work: Not needed If you have labs (blood work) drawn today and your tests are completely normal, you will receive your results only by: [name] MyChart Message (if you have MyChart) OR . A paper copy in the mail If you have any lab test that is abnormal or we need to change your treatment, we will call you to review the results.   Testing/Procedures: WILL BE SCHEDULE AT [address]- [address] 2021 Your physician has requested that you have an echocardiogram. Echocardiography is a painless test that uses sound waves to create images of your heart. It provides your doctor with information about the size and shape of your heart and how well your heart's chambers and valves are working. This procedure takes approximately one hour. There are no restrictions for this procedure.     Follow-Up: At [facility], you and your health needs are our priority.  As part of our continuing mission to provide you with exceptional heart care, we have created designated Provider Care Teams.  These Care Teams include your primary Cardiologist (physician) and Advanced Practice Providers (APPs -  Physician Assistants and Nurse Practitioners) who all work together to provide you with the care you need, when you need it.  We recommend signing up for the patient portal called "MyChart".  Sign up information is provided on this After Visit Summary.  MyChart is used to connect with patients for Virtual Visits (Telemedicine).  Patients are able to view lab/test results, encounter notes, upcoming appointments, etc.  Non-urgent messages can be sent to your provider as well.   To learn more about what you can do with MyChart, go to [email or web address].    Your next appointment:   8 month(s) Nov 2021  The format for your next appointment:   In  Person  Provider:   Bryan Lemma, MD   Other Instructions n/a

## 2019-10-12 ENCOUNTER — Encounter: Payer: Self-pay | Admitting: Cardiology

## 2019-10-12 NOTE — Assessment & Plan Note (Signed)
Now somewhat chronic severe MR with no symptoms.  Normal EF on echo still greater than 60%.  Normal LV size both in systole and end diastole.  Class I CHF symptoms indicating asymptomatic MR. Plan: Continue to follow echoes every 6 to 12 months per guidelines.  With normal blood pressure, would not treat unless she has signs or symptoms of arrhythmias.  We discussed concerning symptoms not only heart failure also arrhythmias.

## 2019-10-12 NOTE — Assessment & Plan Note (Signed)
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  Continue to follow for now for symptoms and changes in LV function or dilation..  Routine echoes.

## 2019-12-09 DIAGNOSIS — S80861A Insect bite (nonvenomous), right lower leg, initial encounter: Secondary | ICD-10-CM | POA: Diagnosis not present

## 2019-12-09 DIAGNOSIS — W57XXXA Bitten or stung by nonvenomous insect and other nonvenomous arthropods, initial encounter: Secondary | ICD-10-CM | POA: Diagnosis not present

## 2020-02-10 DIAGNOSIS — J0110 Acute frontal sinusitis, unspecified: Secondary | ICD-10-CM | POA: Diagnosis not present

## 2020-02-10 DIAGNOSIS — R05 Cough: Secondary | ICD-10-CM | POA: Diagnosis not present

## 2020-04-05 HISTORY — PX: TRANSTHORACIC ECHOCARDIOGRAM: SHX275

## 2020-04-11 ENCOUNTER — Ambulatory Visit (HOSPITAL_COMMUNITY): Payer: BC Managed Care – PPO | Attending: Cardiology

## 2020-04-11 ENCOUNTER — Other Ambulatory Visit: Payer: Self-pay

## 2020-04-11 DIAGNOSIS — I341 Nonrheumatic mitral (valve) prolapse: Secondary | ICD-10-CM | POA: Diagnosis not present

## 2020-04-11 DIAGNOSIS — I340 Nonrheumatic mitral (valve) insufficiency: Secondary | ICD-10-CM | POA: Diagnosis not present

## 2020-04-11 LAB — ECHOCARDIOGRAM COMPLETE
Area-P 1/2: 3.27 cm2
MV M vel: 5.56 m/s
MV Peak grad: 123.4 mmHg
Radius: 0.6 cm
S' Lateral: 2.5 cm

## 2020-06-16 ENCOUNTER — Ambulatory Visit: Payer: BC Managed Care – PPO | Admitting: Cardiology

## 2020-06-17 DIAGNOSIS — Z20822 Contact with and (suspected) exposure to covid-19: Secondary | ICD-10-CM | POA: Diagnosis not present

## 2020-08-29 DIAGNOSIS — Z3040 Encounter for surveillance of contraceptives, unspecified: Secondary | ICD-10-CM | POA: Diagnosis not present

## 2020-08-29 DIAGNOSIS — Z6820 Body mass index (BMI) 20.0-20.9, adult: Secondary | ICD-10-CM | POA: Diagnosis not present

## 2020-08-29 DIAGNOSIS — Z01419 Encounter for gynecological examination (general) (routine) without abnormal findings: Secondary | ICD-10-CM | POA: Diagnosis not present

## 2020-08-29 DIAGNOSIS — Z8349 Family history of other endocrine, nutritional and metabolic diseases: Secondary | ICD-10-CM | POA: Diagnosis not present

## 2020-09-04 ENCOUNTER — Telehealth: Payer: Self-pay | Admitting: *Deleted

## 2020-09-04 ENCOUNTER — Telehealth (INDEPENDENT_AMBULATORY_CARE_PROVIDER_SITE_OTHER): Payer: BC Managed Care – PPO | Admitting: Cardiology

## 2020-09-04 ENCOUNTER — Encounter: Payer: Self-pay | Admitting: Cardiology

## 2020-09-04 VITALS — Ht 72.0 in | Wt 143.8 lb

## 2020-09-04 DIAGNOSIS — I341 Nonrheumatic mitral (valve) prolapse: Secondary | ICD-10-CM | POA: Diagnosis not present

## 2020-09-04 DIAGNOSIS — I340 Nonrheumatic mitral (valve) insufficiency: Secondary | ICD-10-CM

## 2020-09-04 NOTE — Telephone Encounter (Signed)
  First attempt  No answer left a message . Will call back

## 2020-09-04 NOTE — Telephone Encounter (Signed)
RN spoke to patient. Instruction were given  from today's virtual visit 09/04/20 .  AVS SUMMARY has been sent by mychart .  echo ordered for sept 2022 Recall placed for Oct 2022 office visit.  Patient verbalized understanding

## 2020-09-04 NOTE — Progress Notes (Signed)
Virtual Visit via Video Note   This visit type was conducted due to national recommendations for restrictions regarding the COVID-19 Pandemic (e.g. social distancing) in an effort to limit this patient's exposure and mitigate transmission in our community.  Due to her co-morbid illnesses, this patient is at least at moderate risk for complications without adequate follow up.  This format is felt to be most appropriate for this patient at this time.  All issues noted in this document were discussed and addressed.  A limited physical exam was performed with this format.  Please refer to the patient's chart for her consent to telehealth for [facility].      Patient has given verbal permission to conduct this visit via virtual appointment and to bill insurance 09/09/2020 4:27 AM     Evaluation Performed:  Follow-up visit  Date:  09/09/2020   ID:  Andrea Watson, DOB 06-Apr-1981, MRN [id number]  Patient Location: Home Provider Location: Office/Clinic  PCP:  Silverio Lay, MD  Cardiologist:  Bryan Lemma, MD  Electrophysiologist:  None   Chief Complaint:   Chief Complaint  Patient presents with  . Mitral Regurgitation    With mitral prolapse-echo follow-up   ================================  ASSESSMENT & PLAN:    Problem List Items Addressed This Visit    Severe mitral regurgitation by prior echocardiography (Chronic)    Most recent echo showed moderate to severe MR with significant mitral prolapse and preserved EF 60 to 65%-with normal atrial sizes.   Difficult situation because she is totally asymptomatic.  This point we can probably hold off until September and do her annual recheck as recommended per guidelines.  Blood pressures have been normal-therefore would not treat unless she has signs or symptoms of arrhythmias or significant hypertension.  We discussed concerning symptoms for which she should be on the look out.  If these do occur, with proceed with echo sooner rather  than later  Plan: Monitor symptoms.  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  Normal echo and normal atrial sizes.  Continue to monitor with echo every 6 to 12 months-has been stable for 2 echoes, therefore will delay for a complete year-recheck echo in September 2021      Relevant Orders   ECHOCARDIOGRAM COMPLETE     ===================================  History of Present Illness:    Andrea Watson is a 40 y.o. female with PMH notable for Mitral Prolapse with Moderate to Severe MR But Preserved EF who presents via audio/video conferencing for a telehealth visit today.  Andrea Watson was last seen in person on October 06, 2019.  This was to discuss her echo results showing severe MR.  She had normal LV diameters and normal EF.Andrea Watson  She was there with her mother and quite asymptomatic.  She is not very active but does whatever she wants to do.  No irregular heartbeats palpitations.  No syncope or near syncope.  Somewhat incredulous as to the nature of her diagnosis.  Hospitalizations:  . None   Recent - Interim CV studies:   The following studies were reviewed today: . Echocardiogram 04/11/2020: EF 60 to 65%.  Bileaflet mitral valve prolapse with moderate (moderate-severe) highly eccentric MR  Inerval History   Andrea Watson is being seen today via telemedicine.  She is doing pretty well.  She has  been having some intermittent headaches every now and then and with that she gets a little lightheaded and dizzy.  She also is having some periodic vertigo type symptoms.  But otherwise she is doing fine.  She denies any shortness of breath with rest or exertion.  No chest pain or pressure.  No palpitations.  She went to her gynecologist's office, and mentioned that her blood pressures are pretty well controlled.  She really is only on her OCPs  and loratadine.  Otherwise not really taking anything else.  She really has no complaints and is somewhat still incredulous about her diagnosis.  She does not really understand why she is having to have such follow-up done.  Cardiovascular ROS: no chest pain or dyspnea on exertion negative for - edema, irregular heartbeat, orthopnea, palpitations, paroxysmal nocturnal dyspnea, rapid heart rate, shortness of breath or TIA/amaurosis fugax or claudication patient.  She may have a little bit of lightheadedness and dizziness, but no syncope near syncope.  ROS:  Please see the history of present illness.    The patient does not have symptoms concerning for COVID-19 infection (fever, chills, cough, or new shortness of breath).  ROS  The patient is practicing social distancing.  Past Medical History:  Diagnosis Date  . Autism   . History of scoliosis    Repair surgery in childhood  . MVP (mitral valve prolapse) 08/2018   With at least moderate MR   Past Surgical History:  Procedure Laterality Date  . BACK SURGERY     FOR SCOLIOSIS/Spina bifida occulta repair  . TEE WITHOUT CARDIOVERSION N/A 09/23/2018   Procedure: TRANSESOPHAGEAL ECHOCARDIOGRAM (TEE);  Surgeon: Chrystie Nose, MD;  Location: [facility] ENDOSCOPY;  Service: Cardiovascular;  Laterality: N/A;  . TRANSTHORACIC ECHOCARDIOGRAM  08/18/2018   Normal LV systolic function (EF 55-60%); prolapse of anterior MV leaflet; MR not well interrogated but is eccentric, posteriorly directed and at least moderate; suggest TEE to further assess.  . TRANSTHORACIC ECHOCARDIOGRAM  06/2019   Normal LV size and function.  EF 60 to 65%.  Normal RV size and function.  Normal atrial sizes.  Moderate MVP (prolapse of anterior leaflet) with Severe Mitral Valve Regurgitation (posteriorly directed) and no stenosis.  Mild TR.  Normal aortic valve.  No LV dilation-either systolic or diastolic.  Andrea Watson TRANSTHORACIC ECHOCARDIOGRAM  04/2020    EF 60 to 65%.  Bileaflet mitral  valve prolapse with moderate (moderate-severe) highly eccentric MR  . WISDOM TOOTH EXTRACTION       Current Meds  Medication Sig  . b complex vitamins capsule Take 1 capsule by mouth daily.  . Cholecalciferol (VITAMIN D) 50 MCG (2000 UT) CAPS Take 2,000 Units by mouth daily.  . drospirenone-ethinyl estradiol (YASMIN,ZARAH,SYEDA) 3-0.03 MG tablet Take 1 tablet by mouth daily.   . Multiple Vitamin (MULTIVITAMIN) tablet Take 1 tablet by mouth daily.     Allergies:   Other, Molds & smuts, Latex, Macrobid [nitrofurantoin macrocrystal], and Tetanus toxoids   Social History   Tobacco Use  . Smoking status: Never Smoker  . Smokeless tobacco: Never Used  Vaping Use  . Vaping Use: Never used  Substance Use Topics  . Alcohol use: No  . Drug use: No     Family Hx: The patient's family history includes Breast cancer in her paternal grandmother; Cancer in her paternal grandmother; Hypertension in her father.   Labs/Other Tests and Data Reviewed:    EKG:  No ECG reviewed.  Recent Labs: No results found for requested labs  within last 8760 hours.   Recent Lipid Panel Lab Results  Component Value Date/Time   CHOL 186 01/01/2017 11:05 AM   TRIG 130 01/01/2017 11:05 AM   HDL 83 01/01/2017 11:05 AM   CHOLHDL 2.2 01/01/2017 11:05 AM   LDLCALC 77 01/01/2017 11:05 AM    Wt Readings from Last 3 Encounters:  09/04/20 143 lb 12.8 oz (65.2 kg)  10/06/19 144 lb 12.8 oz (65.7 kg)  04/27/19 135 lb (61.2 kg)     Objective:    Vital Signs:  Ht 6' (1.829 m)   Wt 143 lb 12.8 oz (65.2 kg)   BMI 19.50 kg/m   VITAL SIGNS:  reviewed Well nourished, well developed female in no acute distress.  Well-groomed. A&O x 3.  Normal Mood & Affect Non-labored respirations No edema. ===================================  COVID-19 Education: The signs and symptoms of COVID-19 were discussed with the patient and how to seek care for testing (follow up with PCP or arrange E-visit).   The importance of  social distancing was discussed today.  Time:   Today, I have spent 18 minutes with the patient with telehealth technology discussing the above problems.  Additional 8 minutes charting.   Medication Adjustments/Labs and Tests Ordered: Current medicines are reviewed at length with the patient today.  Concerns regarding medicines are outlined above.   Patient Instructions  Medication Instructions:  None *If you need a refill on your cardiac medications before your next appointment, please call your pharmacy*   Lab Work: None   Testing/Procedures: Will be schedule at [facility] [address] Echocardiogram-September/October 2022- Your physician has requested that you have an echocardiogram. Echocardiography is a painless test that uses sound waves to create images of your heart. It provides your doctor with information about the size and shape of your heart and how well your heart's chambers and valves are working. This procedure takes approximately one hour. There are no restrictions for this procedure.     Follow-Up: At [facility], you and your health needs are our priority.  As part of our continuing mission to provide you with exceptional heart care, we have created designated Provider Care Teams.  These Care Teams include your primary Cardiologist (physician) and Advanced Practice Providers (APPs -  Physician Assistants and Nurse Practitioners) who all work together to provide you with the care you need, when you need it.    Your next appointment:   9 month(s) -> after echocardiogram has been done and read  The format for your next appointment:   In Person  Provider:   Bryan Lemma, MD   Other Instructions Continue to monitor for signs and symptoms of shortness of breath with or without exertion, short of breath lying down or waking up in the middle night short of breath.  Racing heart rates or palpitations that make you feel dizzy woozy short of breath.   Chest pain or pressure with rest or exertion.     Signed, Bryan Lemma, MD  09/09/2020 4:27 AM    [facility]

## 2020-09-04 NOTE — Assessment & Plan Note (Addendum)
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  Normal echo and normal atrial sizes.  Continue to monitor with echo every 6 to 12 months-has been stable for 2 echoes, therefore will delay for a complete year-recheck echo in September 2021

## 2020-09-04 NOTE — Patient Instructions (Addendum)
Medication Instructions:  None *If you need a refill on your cardiac medications before your next appointment, please call your pharmacy*   Lab Work: None   Testing/Procedures: Will be schedule at [facility] [address] Echocardiogram-September/October 2022- Your physician has requested that you have an echocardiogram. Echocardiography is a painless test that uses sound waves to create images of your heart. It provides your doctor with information about the size and shape of your heart and how well your heart's chambers and valves are working. This procedure takes approximately one hour. There are no restrictions for this procedure.     Follow-Up: At [facility], you and your health needs are our priority.  As part of our continuing mission to provide you with exceptional heart care, we have created designated Provider Care Teams.  These Care Teams include your primary Cardiologist (physician) and Advanced Practice Providers (APPs -  Physician Assistants and Nurse Practitioners) who all work together to provide you with the care you need, when you need it.    Your next appointment:   9 month(s) -> after echocardiogram has been done and read  The format for your next appointment:   In Person  Provider:   Bryan Lemma, MD   Other Instructions Continue to monitor for signs and symptoms of shortness of breath with or without exertion, short of breath lying down or waking up in the middle night short of breath.  Racing heart rates or palpitations that make you feel dizzy woozy short of breath.  Chest pain or pressure with rest or exertion.

## 2020-09-04 NOTE — Telephone Encounter (Signed)
Unable to reach patient by phone. Patient is on Mychart video and patient and Dr Herbie Baltimore are proceeding with visit.

## 2020-09-04 NOTE — Assessment & Plan Note (Addendum)
Most recent echo showed moderate to severe MR with significant mitral prolapse and preserved EF 60 to 65%-with normal atrial sizes.   Difficult situation because she is totally asymptomatic.  This point we can probably hold off until September and do her annual recheck as recommended per guidelines.  Blood pressures have been normal-therefore would not treat unless she has signs or symptoms of arrhythmias or significant hypertension.  We discussed concerning symptoms for which she should be on the look out.  If these do occur, with proceed with echo sooner rather than later  Plan: Monitor symptoms.  Recheck echo in September 2021

## 2020-09-04 NOTE — Telephone Encounter (Signed)
Second attempt call no answer will call again   patient

## 2020-09-09 ENCOUNTER — Encounter: Payer: Self-pay | Admitting: Cardiology

## 2020-09-22 DIAGNOSIS — Z20822 Contact with and (suspected) exposure to covid-19: Secondary | ICD-10-CM | POA: Diagnosis not present

## 2020-12-18 DIAGNOSIS — L2089 Other atopic dermatitis: Secondary | ICD-10-CM | POA: Diagnosis not present

## 2020-12-18 DIAGNOSIS — D485 Neoplasm of uncertain behavior of skin: Secondary | ICD-10-CM | POA: Diagnosis not present

## 2021-05-02 ENCOUNTER — Other Ambulatory Visit (HOSPITAL_COMMUNITY): Payer: BC Managed Care – PPO

## 2021-05-11 ENCOUNTER — Other Ambulatory Visit: Payer: Self-pay

## 2021-05-11 ENCOUNTER — Ambulatory Visit (HOSPITAL_COMMUNITY): Payer: BC Managed Care – PPO | Attending: Cardiology

## 2021-05-11 DIAGNOSIS — I341 Nonrheumatic mitral (valve) prolapse: Secondary | ICD-10-CM | POA: Diagnosis not present

## 2021-05-11 DIAGNOSIS — I340 Nonrheumatic mitral (valve) insufficiency: Secondary | ICD-10-CM | POA: Insufficient documentation

## 2021-05-11 HISTORY — PX: TRANSTHORACIC ECHOCARDIOGRAM: SHX275

## 2021-05-11 LAB — ECHOCARDIOGRAM COMPLETE
Area-P 1/2: 3.37 cm2
S' Lateral: 2.5 cm

## 2021-06-25 ENCOUNTER — Encounter: Payer: Self-pay | Admitting: Cardiology

## 2021-06-25 ENCOUNTER — Ambulatory Visit (INDEPENDENT_AMBULATORY_CARE_PROVIDER_SITE_OTHER): Payer: BC Managed Care – PPO | Admitting: Cardiology

## 2021-06-25 ENCOUNTER — Other Ambulatory Visit: Payer: Self-pay

## 2021-06-25 VITALS — BP 120/80 | HR 73 | Ht 71.75 in | Wt 154.0 lb

## 2021-06-25 DIAGNOSIS — I341 Nonrheumatic mitral (valve) prolapse: Secondary | ICD-10-CM | POA: Diagnosis not present

## 2021-06-25 DIAGNOSIS — I340 Nonrheumatic mitral (valve) insufficiency: Secondary | ICD-10-CM

## 2021-06-25 NOTE — Patient Instructions (Addendum)
Medication Instructions:  NO CHANGES  *If you need a refill on your cardiac medications before your next appointment, please call your pharmacy*   Lab Work: NOT NEEDED    Testing/Procedures:  [address] --OCT 2023 Your physician has requested that you have an echocardiogram. Echocardiography is a painless test that uses sound waves to create images of your heart. It provides your doctor with information about the size and shape of your heart and how well your heart's chambers and valves are working. This procedure takes approximately one hour. There are no restrictions for this procedure.   Follow-Up: At [facility], you and your health needs are our priority.  As part of our continuing mission to provide you with exceptional heart care, we have created designated Provider Care Teams.  These Care Teams include your primary Cardiologist (physician) and Advanced Practice Providers (APPs -  Physician Assistants and Nurse Practitioners) who all work together to provide you with the care you need, when you need it.     Your next appointment:   12 month(s) AFTER ECHO IS COMPLETED IN OCT 2023  The format for your next appointment:   In Person  Provider:   Bryan Lemma, MD

## 2021-06-25 NOTE — Progress Notes (Signed)
Primary Care Provider: Delsa Bern, MD Cardiologist: Glenetta Hew, MD Electrophysiologist: None  Clinic Note: Chief Complaint  Patient presents with   Follow-up    Annual.  Rare echocardiogram   Mitral Valve Prolapse    Without regurgitation.  Echo performed.     ===================================  ASSESSMENT/PLAN   Problem List Items Addressed This Visit       Cardiology Problems   Severe mitral regurgitation by prior echocardiography - Primary (Chronic)    Current echo still shows moderate to severe MR.  With no symptoms, will continue annual follow-up.      Relevant Orders   EKG 12-Lead (Completed)   ECHOCARDIOGRAM COMPLETE   MVP (mitral valve prolapse) (Chronic)    Asymptomatic mitral prolapse.  Continue to follow-up with annual echocardiogram.  Monitor blood pressures.  Treat accordingly, for now blood pressures been pretty stable.  Plan echo in October 2023.  We will see after.      Relevant Orders   EKG 12-Lead (Completed)   ECHOCARDIOGRAM COMPLETE   ===================================  HPI:    Andrea Watson is a 40 y.o. 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  Andrea Watson was last seen on September 04, 2020 via telemedicine video conferencing..  She is doing very well.  Is having intermittent headaches with little lightheadedness and dizziness but no arrhythmia symptoms.  No CHF symptoms.  No chest pain or pressure.  Plan was to check echocardiogram this fall and see her back after.  Recent Hospitalizations: None  Reviewed  CV studies:    The following studies were reviewed today: (if available, images/films reviewed: From Epic Chart or Care Everywhere) TTE 05/11/2021: EF 60 to 65%.  Normal wall motion.  Normal strain.  Normal diastolic pressures.  Normal RV.  Myxomatous Mitral Valve with mostly A3 MVP with moderate MR.  Mild late systolic prolapse of  both leaflets.  Normal aortic valve.  Normal IVC/CVP.  No change from previous.  Interval History:   Andrea Watson returns here today for routine follow-up.  She states her doing relatively well.  Also notes that every now and then she will get worn out after about 20 minutes of walking on a treadmill.  Not really described as being short of breath, simply just loses energy.  Sometimes of her heart rate gets up fast she may get little dizzy.  No syncope or near syncope.  CV Review of Symptoms (Summary) Cardiovascular ROS: no chest pain or dyspnea on exertion positive for - - Mild exercise intolerance. negative for - edema, irregular heartbeat, orthopnea, palpitations, paroxysmal nocturnal dyspnea, rapid heart rate, shortness of breath, or Syncope/near syncope or TIA/CVA/amaurosis fugax, claudication  REVIEWED OF SYSTEMS   Review of Systems  Constitutional:  Negative for malaise/fatigue and weight loss.  HENT:  Negative for congestion.   Respiratory:  Negative for cough and shortness of breath.   Gastrointestinal:  Negative for blood in stool and melena.  Genitourinary:  Negative for hematuria.  Musculoskeletal:  Negative for joint pain.  Neurological:  Positive for dizziness (Sometimes associated with "overdoing" it). Negative for weakness.  Psychiatric/Behavioral: Negative.     I have reviewed and (if needed) personally updated the patient's problem list, medications, allergies, past medical and surgical history, social and family history.   PAST MEDICAL HISTORY   Past Medical History:  Diagnosis Date   Autism    History of scoliosis  Repair surgery in childhood   MVP (mitral valve prolapse) 08/2018   With at least moderate MR    PAST SURGICAL HISTORY   Past Surgical History:  Procedure Laterality Date   BACK SURGERY     FOR SCOLIOSIS/Spina bifida occulta repair   TEE WITHOUT CARDIOVERSION N/A 09/23/2018   Procedure: TRANSESOPHAGEAL ECHOCARDIOGRAM (TEE);  Surgeon: Chrystie Nose, MD;  Location[facility];  Service: Cardiovascular;  Laterality: N/A;   TRANSTHORACIC ECHOCARDIOGRAM  08/18/2018   Normal LV systolic function (EF 55-60%); prolapse of anterior MV leaflet; MR not well interrogated but is eccentric, posteriorly directed and at least moderate; suggest TEE to further assess.   TRANSTHORACIC ECHOCARDIOGRAM  06/2019   a) Normal LV size and function.  EF 60 to 65%.  Normal RV size and function.  Normal atrial sizes.  Moderate MVP (prolapse of anterior leaflet) with Severe Mitral Valve Regurgitation (posteriorly directed) and no stenosis.  Mild TR.  Normal aortic valve.  No LV dilation-either systolic or diastolic.;; b) 04/2020: EF 60 to 65%.  Bileaflet MVPw/ Mod-Severe -highly eccentric MR   TRANSTHORACIC ECHOCARDIOGRAM  05/11/2021   EF 60 to 65%.  Normal wall motion.  Normal strain.  Normal diastolic pressures.  Normal RV.  Myxomatous Mitral Valve with mostly A3 MVP with moderate MR.  Mild late systolic prolapse of both leaflets.  Normal aortic valve.  Normal IVC/CVP.  No change from previous.   WISDOM TOOTH EXTRACTION      Immunization History  Administered Date(s) Administered   Influenza,inj,Quad PF,6+ Mos 07/14/2012   Moderna Sars-Covid-2 Vaccination 10/22/2019, 11/23/2019    MEDICATIONS/ALLERGIES   Current Meds  Medication Sig   b complex vitamins capsule Take 1 capsule by mouth daily.   Cholecalciferol (VITAMIN D) 50 MCG (2000 UT) CAPS Take 2,000 Units by mouth daily.   drospirenone-ethinyl estradiol (YASMIN,ZARAH,SYEDA) 3-0.03 MG tablet Take 1 tablet by mouth daily.    fexofenadine (ALLEGRA) 180 MG tablet Take 180 mg by mouth daily.   meclizine (ANTIVERT) 25 MG tablet Take 25 mg by mouth as needed.   Multiple Vitamin (MULTIVITAMIN) tablet Take 1 tablet by mouth daily.    Allergies  Allergen Reactions   Other Other (See Comments), Rash, Nausea Only, Diarrhea and Nausea And Vomiting   Latex Rash   Macrobid [Nitrofurantoin Macrocrystal]  Rash   Tetanus Toxoids Nausea And Vomiting and Rash    Chills    SOCIAL HISTORY/FAMILY HISTORY   Reviewed in Epic:  Pertinent findings:  Social History   Tobacco Use   Smoking status: Never   Smokeless tobacco: Never  Vaping Use   Vaping Use: Never used  Substance Use Topics   Alcohol use: No   Drug use: No   Social History   Social History Narrative   Not on file    OBJCTIVE -PE, EKG, labs   Wt Readings from Last 3 Encounters:  06/25/21 154 lb (69.9 kg)  09/04/20 143 lb 12.8 oz (65.2 kg)  10/06/19 144 lb 12.8 oz (65.7 kg)    Physical Exam: BP 120/80 (BP Location: Right Arm)   Pulse 73   Ht 5' 11.75" (1.822 m)   Wt 154 lb (69.9 kg)   SpO2 99%   BMI 21.03 kg/m  Physical Exam Vitals reviewed.  Constitutional:      General: She is not in acute distress.    Appearance: Normal appearance. She is normal weight. She is not ill-appearing or toxic-appearing.     Comments: Well-nourished and well-groomed.  HENT:  Head: Normocephalic and atraumatic.  Neck:     Vascular: No carotid bruit, hepatojugular reflux or JVD.  Cardiovascular:     Rate and Rhythm: Normal rate and regular rhythm. No extrasystoles are present.    Chest Wall: PMI is not displaced.     Pulses: Normal pulses.     Heart sounds: S1 normal and S2 normal. Murmur (3/6 HSM at apex--back) heard.    No friction rub. No gallop.  Pulmonary:     Effort: Pulmonary effort is normal. No respiratory distress.     Breath sounds: Normal breath sounds.  Chest:     Chest wall: No tenderness.  Abdominal:     General: Abdomen is flat. Bowel sounds are normal. There is no distension.     Palpations: Abdomen is soft.  Musculoskeletal:     Cervical back: Normal range of motion and neck supple.  Skin:    General: Skin is warm.  Neurological:     General: No focal deficit present.     Mental Status: She is alert and oriented to person, place, and time.  Psychiatric:        Mood and Affect: Mood normal.         Behavior: Behavior normal.        Thought Content: Thought content normal.        Judgment: Judgment normal.     Adult ECG Report  Rate: 73;  Rhythm: normal sinus rhythm and Borderline right axis deviation.  RSR, I RBB' ; Otherwise normal intervals durations.  Narrative Interpretation: Stable  Recent Labs:    03/26/2021: TC 170, TG 191, HDL 33, LDL 107.  TSH 0.40. 05/06/2021:  Cr 0.880, Hgb 13.3, K+ 3.9.  Lab Results  Component Value Date   CHOL 186 01/01/2017   HDL 83 01/01/2017   LDLCALC 77 01/01/2017   TRIG 130 01/01/2017   CHOLHDL 2.2 01/01/2017   Lab Results  Component Value Date   CREATININE 0.78 09/09/2018   BUN 8 09/09/2018   NA 139 09/09/2018   K 4.4 09/09/2018   CL 102 09/09/2018   CO2 22 09/09/2018   CBC Latest Ref Rng & Units 09/09/2018 01/01/2017 01/10/2012  WBC 3.4 - 10.8 x10E3/uL 7.2 6.7 11.4(H)  Hemoglobin 11.1 - 15.9 g/dL 14.3 13.8 13.9  Hematocrit 34.0 - 46.6 % 41.2 41.5 41.0  Platelets 150 - 450 x10E3/uL 340 328 335    No results found for: HGBA1C Lab Results  Component Value Date   TSH 1.25 01/01/2017    ==================================================  COVID-19 Education: The signs and symptoms of COVID-19 were discussed with the patient and how to seek care for testing (follow up with PCP or arrange E-visit).    I spent a total of 21 minutes with the patient spent in direct patient consultation.  Additional time spent with chart review  / charting (studies, outside notes, etc): 14 min Total Time: 35 min  Current medicines are reviewed at length with the patient today.  (+/- concerns) none  This visit occurred during the SARS-CoV-2 public health emergency.  Safety protocols were in place, including screening questions prior to the visit, additional usage of staff PPE, and extensive cleaning of exam room while observing appropriate contact time as indicated for disinfecting solutions.  Notice: This dictation was prepared with Dragon dictation  along with smart phrase technology. Any transcriptional errors that result from this process are unintentional and may not be corrected upon review.  Studies Ordered:   Orders Placed This Encounter  Procedures   EKG 12-Lead   ECHOCARDIOGRAM COMPLETE    Patient Instructions / Medication Changes & Studies & Tests Ordered   Patient Instructions  Medication Instructions:  NO CHANGES  *If you need a refill on your cardiac medications before your next appointment, please call your pharmacy*   Lab Work: NOT NEEDED    Testing/Procedures:  [address] --OCT 2023 Your physician has requested that you have an echocardiogram. Echocardiography is a painless test that uses sound waves to create images of your heart. It provides your doctor with information about the size and shape of your heart and how well your heart's chambers and valves are working. This procedure takes approximately one hour. There are no restrictions for this procedure.   Follow-Up: At [facility], you and your health needs are our priority.  As part of our continuing mission to provide you with exceptional heart care, we have created designated Provider Care Teams.  These Care Teams include your primary Cardiologist (physician) and Advanced Practice Providers (APPs -  Physician Assistants and Nurse Practitioners) who all work together to provide you with the care you need, when you need it.     Your next appointment:   12 month(s) AFTER ECHO IS COMPLETED IN OCT 2023  The format for your next appointment:   In Person  Provider:   Bryan Lemma, MD      Bryan Lemma, M.D., M.S. Interventional Cardiologist   Pager # [phone] Phone # [phone] [address] [place], [place] [address]   Thank you for choosing [facility] at [facility]!!

## 2021-07-19 ENCOUNTER — Encounter: Payer: Self-pay | Admitting: Cardiology

## 2021-07-19 NOTE — Assessment & Plan Note (Signed)
Current echo still shows moderate to severe MR.  With no symptoms, will continue annual follow-up.

## 2021-07-19 NOTE — Assessment & Plan Note (Signed)
Asymptomatic mitral prolapse.  Continue to follow-up with annual echocardiogram.  Monitor blood pressures.  Treat accordingly, for now blood pressures been pretty stable.  Plan echo in October 2023.  We will see after.

## 2021-08-30 DIAGNOSIS — Z6820 Body mass index (BMI) 20.0-20.9, adult: Secondary | ICD-10-CM | POA: Diagnosis not present

## 2021-08-30 DIAGNOSIS — Z01419 Encounter for gynecological examination (general) (routine) without abnormal findings: Secondary | ICD-10-CM | POA: Diagnosis not present

## 2021-08-30 DIAGNOSIS — Z3040 Encounter for surveillance of contraceptives, unspecified: Secondary | ICD-10-CM | POA: Diagnosis not present

## 2021-08-30 DIAGNOSIS — Z1231 Encounter for screening mammogram for malignant neoplasm of breast: Secondary | ICD-10-CM | POA: Diagnosis not present

## 2021-09-07 ENCOUNTER — Other Ambulatory Visit: Payer: Self-pay | Admitting: Obstetrics and Gynecology

## 2021-09-07 DIAGNOSIS — R928 Other abnormal and inconclusive findings on diagnostic imaging of breast: Secondary | ICD-10-CM

## 2021-10-02 ENCOUNTER — Other Ambulatory Visit: Payer: Self-pay | Admitting: Obstetrics and Gynecology

## 2021-10-02 DIAGNOSIS — R928 Other abnormal and inconclusive findings on diagnostic imaging of breast: Secondary | ICD-10-CM

## 2021-10-04 ENCOUNTER — Other Ambulatory Visit: Payer: Self-pay | Admitting: Obstetrics and Gynecology

## 2021-10-04 DIAGNOSIS — R928 Other abnormal and inconclusive findings on diagnostic imaging of breast: Secondary | ICD-10-CM

## 2021-10-29 ENCOUNTER — Other Ambulatory Visit: Payer: BC Managed Care – PPO

## 2021-11-03 ENCOUNTER — Ambulatory Visit
Admission: RE | Admit: 2021-11-03 | Discharge: 2021-11-03 | Disposition: A | Discharge status: Home / Self Care | Payer: BC Managed Care – PPO | Source: Ambulatory Visit | Attending: Obstetrics and Gynecology | Admitting: Obstetrics and Gynecology

## 2021-11-03 DIAGNOSIS — R928 Other abnormal and inconclusive findings on diagnostic imaging of breast: Secondary | ICD-10-CM

## 2021-11-03 DIAGNOSIS — R922 Inconclusive mammogram: Secondary | ICD-10-CM | POA: Diagnosis not present

## 2021-11-22 ENCOUNTER — Other Ambulatory Visit: Payer: Self-pay | Admitting: Obstetrics and Gynecology

## 2021-11-22 DIAGNOSIS — Z09 Encounter for follow-up examination after completed treatment for conditions other than malignant neoplasm: Secondary | ICD-10-CM

## 2021-11-22 DIAGNOSIS — N6489 Other specified disorders of breast: Secondary | ICD-10-CM

## 2022-03-11 ENCOUNTER — Telehealth: Payer: Self-pay | Admitting: Cardiology

## 2022-03-11 NOTE — Telephone Encounter (Signed)
Spoke to patient she wanted to know if ok to take aspirin for a headache.Advised to take tylenol.

## 2022-03-11 NOTE — Telephone Encounter (Signed)
Pt would like to know if she can take aspirin after her valve replacement. Please advise

## 2022-05-07 ENCOUNTER — Other Ambulatory Visit: Payer: Self-pay | Admitting: Obstetrics and Gynecology

## 2022-05-07 ENCOUNTER — Ambulatory Visit
Admission: RE | Admit: 2022-05-07 | Discharge: 2022-05-07 | Disposition: A | Discharge status: Home / Self Care | Payer: BC Managed Care – PPO | Source: Ambulatory Visit | Attending: Obstetrics and Gynecology | Admitting: Obstetrics and Gynecology

## 2022-05-07 DIAGNOSIS — N6489 Other specified disorders of breast: Secondary | ICD-10-CM

## 2022-05-07 DIAGNOSIS — Z09 Encounter for follow-up examination after completed treatment for conditions other than malignant neoplasm: Secondary | ICD-10-CM

## 2022-05-07 DIAGNOSIS — R928 Other abnormal and inconclusive findings on diagnostic imaging of breast: Secondary | ICD-10-CM | POA: Diagnosis not present

## 2022-05-09 ENCOUNTER — Encounter: Payer: Self-pay | Admitting: Cardiology

## 2022-05-09 ENCOUNTER — Ambulatory Visit: Payer: BC Managed Care – PPO | Admitting: Cardiology

## 2022-05-09 VITALS — BP 124/83 | HR 77 | Temp 97.9°F | Resp 16 | Ht 71.0 in | Wt 157.4 lb

## 2022-05-09 DIAGNOSIS — I341 Nonrheumatic mitral (valve) prolapse: Secondary | ICD-10-CM

## 2022-05-09 DIAGNOSIS — I340 Nonrheumatic mitral (valve) insufficiency: Secondary | ICD-10-CM

## 2022-05-09 NOTE — Progress Notes (Signed)
ID:  RACHELLE EDWARDS, DOB 02-10-1981, MRN [id number]  PCP:  Delsa Bern, MD  Cardiologist:  Rex Kras, DO, [facility] (established care 05/09/2022) Former Cardiology Providers: Dr. Glenetta Hew  REASON FOR CONSULT: Mitral valve disease  REQUESTING PHYSICIAN:  Delsa Bern, MD [address], [place],  [place] [address]  Chief Complaint  Patient presents with   Mitral Valve Prolapse   New Patient (Initial Visit)    HPI  MISHIKA FLIPPEN is a 41 y.o. Caucasian female who presents to the clinic for evaluation of mitral valve disease/second opinion at the request of Rivard, Katharine Look, MD. 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  Patient is accompanied by her husband who also provides collateral history at today's office visit.  Patient is currently under the care of Dr. Ellyn Hack and is here for second opinion regarding her mitral valve disease.  Patient has a known history of mitral valve prolapse and mitral regurgitation.  She has been followed by [facility] and is due for an echocardiogram on May 17, 2022.  Her last echo was performed in October 2022 which notes preserved LVEF, normal GLS, normal diastolic function.  Mitral valve was reported to be myxomatous, prolapse of predominant A3 scallop, with at least moderate MR.  Over the last 6 months to a year she has noted progressive decline in physical stamina/functional capacity.  About a year ago she was exercising on a treadmill for an hour, last 6 months she is only able to do 30 minutes of treadmill, and now is down to once a week.   She denies anginal discomfort or heart failure symptoms.  No family history of premature coronary disease or sudden cardiac death.  ALLERGIES: Allergies  Allergen Reactions   Other Other (See Comments), Rash, Nausea Only, Diarrhea and Nausea And Vomiting   Latex Rash    Macrobid [Nitrofurantoin Macrocrystal] Rash   Tetanus Toxoids Nausea And Vomiting and Rash    Chills    MEDICATION LIST PRIOR TO VISIT: Current Meds  Medication Sig   b complex vitamins capsule Take 1 capsule by mouth daily.   Cholecalciferol (VITAMIN D) 50 MCG (2000 UT) CAPS Take 2,000 Units by mouth daily.   drospirenone-ethinyl estradiol (YASMIN,ZARAH,SYEDA) 3-0.03 MG tablet Take 1 tablet by mouth daily.    meclizine (ANTIVERT) 25 MG tablet Take 25 mg by mouth as needed.   Multiple Vitamin (MULTIVITAMIN) tablet Take 1 tablet by mouth daily.     PAST MEDICAL HISTORY: Past Medical History:  Diagnosis Date   Autism    History of scoliosis    Repair surgery in childhood   MVP (mitral valve prolapse) 08/2018   With at least moderate MR    PAST SURGICAL HISTORY: Past Surgical History:  Procedure Laterality Date   BACK SURGERY     FOR SCOLIOSIS/Spina bifida occulta repair   TEE WITHOUT CARDIOVERSION N/A 09/23/2018   Procedure: TRANSESOPHAGEAL ECHOCARDIOGRAM (TEE);  Surgeon: Pixie Casino, MD;  Location: [facility] ENDOSCOPY;  Service: Cardiovascular;  Laterality: N/A;   TRANSTHORACIC ECHOCARDIOGRAM  08/18/2018   Normal LV systolic function (EF 38-75%); prolapse of anterior MV leaflet; MR not well interrogated but is eccentric, posteriorly directed and at least moderate; suggest TEE to further assess.   TRANSTHORACIC ECHOCARDIOGRAM  06/2019   a) Normal LV size and function.  EF 60 to 65%.  Normal RV size and function.  Normal atrial sizes.  Moderate MVP (prolapse  of anterior leaflet) with Severe Mitral Valve Regurgitation (posteriorly directed) and no stenosis.  Mild TR.  Normal aortic valve.  No LV dilation-either systolic or diastolic.;; b) 04/2020: EF 60 to 65%.  Bileaflet MVPw/ Mod-Severe -highly eccentric MR   TRANSTHORACIC ECHOCARDIOGRAM  05/11/2021   EF 60 to 65%.  Normal wall motion.  Normal strain.  Normal diastolic pressures.  Normal RV.  Myxomatous Mitral Valve with mostly A3 MVP  with moderate MR.  Mild late systolic prolapse of both leaflets.  Normal aortic valve.  Normal IVC/CVP.  No change from previous.   WISDOM TOOTH EXTRACTION      FAMILY HISTORY: The patient family history includes Breast cancer in her paternal grandmother; Cancer in her paternal grandmother; Hypertension in her father; Thyroid disease in her mother.  SOCIAL HISTORY:  The patient  reports that she has never smoked. She has never used smokeless tobacco. She reports that she does not drink alcohol and does not use drugs.  REVIEW OF SYSTEMS: Review of Systems  Constitutional: Positive for malaise/fatigue.  Cardiovascular:  Negative for chest pain, claudication, dyspnea on exertion, irregular heartbeat, leg swelling, near-syncope, orthopnea, palpitations, paroxysmal nocturnal dyspnea and syncope.  Respiratory:  Negative for shortness of breath.   Hematologic/Lymphatic: Negative for bleeding problem.  Musculoskeletal:  Negative for muscle cramps and myalgias.  Neurological:  Positive for dizziness and light-headedness.    PHYSICAL EXAM:    05/09/2022    9:15 AM 06/25/2021    3:24 PM 09/04/2020   10:32 AM  Vitals with BMI  Height 5\' 11"  5' 11.75" 6\' 0"   Weight 157 lbs 6 oz 154 lbs 143 lbs 13 oz  BMI 21.96 21.04 19.5  Systolic 124 120   Diastolic 83 80   Pulse 77 73    Physical Exam  Constitutional: No distress.  Age appropriate, hemodynamically stable.   Neck: No JVD present.  Cardiovascular: Normal rate, regular rhythm, S1 normal, S2 normal, intact distal pulses and normal pulses. Exam reveals no gallop, no S3 and no S4.  Murmur heard. High-pitched blowing holosystolic murmur is present with a grade of 4/6 at the apex. Pulses:      Dorsalis pedis pulses are 2+ on the right side and 2+ on the left side.       Posterior tibial pulses are 2+ on the right side and 2+ on the left side.  Pulmonary/Chest: Effort normal and breath sounds normal. No stridor. She has no wheezes. She has no  rales.  Abdominal: Soft. Bowel sounds are normal. She exhibits no distension. There is no abdominal tenderness.  Musculoskeletal:        General: No edema.     Cervical back: Neck supple.  Neurological: She is alert and oriented to person, place, and time. She has intact cranial nerves (2-12).  Skin: Skin is warm and moist.   CARDIAC DATABASE: EKG: 05/09/2022: Sinus Rhythm, 79bpm, IRBBB, right axis, without underlying injury pattern.   Echocardiogram: 05/11/2021:  1. Left ventricular ejection fraction, by estimation, is 60 to 65%. The left ventricle has normal function. The left ventricle has no regional  wall motion abnormalities. Left ventricular diastolic parameters were normal. The average left ventricular global longitudinal strain is -21.1 %. The global longitudinal strain is normal.   2. Right ventricular systolic function is normal. The right ventricular size is normal.   3. The mitral regurgitant jet appears to originate primarily due to prolapse at A3 (medial segment of the anterior leaflet) and is difficult  to quantify due  to its eccentricity. The mitral valve is myxomatous. Moderate mitral valve regurgitation. No evidence of mitral stenosis. There is mild late systolic prolapse of both leaflets of the mitral valve.   4. The aortic valve is tricuspid. Aortic valve regurgitation is not visualized. No aortic stenosis is present.   5. The inferior vena cava is normal in size with greater than 50% respiratory variability, suggesting right atrial pressure of 3 mmHg.   Stress Testing: No results found for this or any previous visit from the past 1095 days.   Heart Catheterization: None  LABORATORY DATA:    Latest Ref Rng & Units 09/09/2018   12:36 PM 01/01/2017   11:05 AM 01/10/2012    2:37 PM  CBC  WBC 3.4 - 10.8 x10E3/uL 7.2  6.7  11.4   Hemoglobin 11.1 - 15.9 g/dL 64.4  03.4  74.2   Hematocrit 34.0 - 46.6 % 41.2  41.5  41.0   Platelets 150 - 450 x10E3/uL 340  328  335         Latest Ref Rng & Units 09/09/2018   12:36 PM 01/01/2017   11:05 AM  CMP  Glucose 65 - 99 mg/dL 78  79   BUN 6 - 20 mg/dL 8  10   Creatinine 5.95 - 1.00 mg/dL 6.38  7.56   Sodium 433 - 144 mmol/L 139  140   Potassium 3.5 - 5.2 mmol/L 4.4  4.1   Chloride 96 - 106 mmol/L 102  106   CO2 20 - 29 mmol/L 22  23   Calcium 8.7 - 10.2 mg/dL 9.9  9.5   Total Protein 6.1 - 8.1 g/dL  6.8   Total Bilirubin 0.2 - 1.2 mg/dL  0.5   Alkaline Phos 33 - 115 U/L  35   AST 10 - 30 U/L  18   ALT 6 - 29 U/L  19     Lipid Panel     Component Value Date/Time   CHOL 186 01/01/2017 1105   TRIG 130 01/01/2017 1105   HDL 83 01/01/2017 1105   CHOLHDL 2.2 01/01/2017 1105   VLDL 26 01/01/2017 1105   LDLCALC 77 01/01/2017 1105    No components found for: "NTPROBNP" No results for input(s): "PROBNP" in the last 8760 hours. No results for input(s): "TSH" in the last 8760 hours.  BMP No results for input(s): "NA", "K", "CL", "CO2", "GLUCOSE", "BUN", "CREATININE", "CALCIUM", "GFRNONAA", "GFRAA" in the last 8760 hours.  HEMOGLOBIN A1C No results found for: "HGBA1C", "MPG"  IMPRESSION:    ICD-10-CM   1. MVP (mitral valve prolapse)  I34.1 EKG 12-Lead    2. Nonrheumatic mitral valve regurgitation  I34.0        RECOMMENDATIONS: REEM FLEURY is a 41 y.o. 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  Symptomatic nonrheumatic mitral regurgitation: Known history of mitral valve prolapse with mitral regurgitation which has been followed medically. Patient is here for second opinion. I have reassured both the patient and her husband that our colleagues at [facility] provided her optimal care. However, I suspect that the disease is worsening and she is now more symptomatic (reduced physical endurance/tolerance).  Patient is scheduled for transthoracic echo 05/17/2022. After  the transthoracic echo she needs to have a follow-up with either her primary cardiologist Dr. Herbie Baltimore or myself to review the images and discuss plan of care.  She will decide which practice she  will follow long-term. She denies any heart failure symptoms or syncope.  If they arise she is asked to go to the closest ER via EMS. I will not start her on ACE inhibitors or ARB for now as she is already having episodes of lightheaded and dizziness.  Office blood pressures are well controlled.  Plan of care discussed with the patient and husband.  Data Reviewed: I have independently reviewed external notes provided by the referring provider as part of this office visit.   I have independently reviewed echo images 2022, office notes, EKG, labs as part of medical decision making. I have ordered the following tests:  Orders Placed This Encounter  Procedures   EKG 12-Lead  I have made no medications changes at today's encounter as noted above. History of present illness was obtained by both patient and husband at today's office visit.    FINAL MEDICATION LIST END OF ENCOUNTER: No orders of the defined types were placed in this encounter.   Medications Discontinued During This Encounter  Medication Reason   fexofenadine (ALLEGRA) 180 MG tablet      Current Outpatient Medications:    b complex vitamins capsule, Take 1 capsule by mouth daily., Disp: , Rfl:    Cholecalciferol (VITAMIN D) 50 MCG (2000 UT) CAPS, Take 2,000 Units by mouth daily., Disp: , Rfl:    drospirenone-ethinyl estradiol (YASMIN,ZARAH,SYEDA) 3-0.03 MG tablet, Take 1 tablet by mouth daily. , Disp: , Rfl:    meclizine (ANTIVERT) 25 MG tablet, Take 25 mg by mouth as needed., Disp: , Rfl:    Multiple Vitamin (MULTIVITAMIN) tablet, Take 1 tablet by mouth daily., Disp: , Rfl:   Orders Placed This Encounter  Procedures   EKG 12-Lead    There are no Patient Instructions on file for this visit.   --Continue cardiac medications as  reconciled in final medication list. --Return in about 12 days (around 05/21/2022). or sooner if needed. --Continue follow-up with your primary care physician regarding the management of your other chronic comorbid conditions.  Patient's questions and concerns were addressed to her satisfaction. She voices understanding of the instructions provided during this encounter.   This note was created using a voice recognition software as a result there may be grammatical errors inadvertently enclosed that do not reflect the nature of this encounter. Every attempt is made to correct such errors.  Tessa Lerner, [place], [facility]  Pager: [phone] Office: [phone]

## 2022-05-17 ENCOUNTER — Ambulatory Visit (HOSPITAL_COMMUNITY): Payer: BC Managed Care – PPO | Attending: Cardiology

## 2022-05-17 DIAGNOSIS — I341 Nonrheumatic mitral (valve) prolapse: Secondary | ICD-10-CM | POA: Diagnosis not present

## 2022-05-17 DIAGNOSIS — I340 Nonrheumatic mitral (valve) insufficiency: Secondary | ICD-10-CM | POA: Diagnosis not present

## 2022-05-17 LAB — ECHOCARDIOGRAM COMPLETE
Area-P 1/2: 4.77 cm2
MV M vel: 4.63 m/s
MV Peak grad: 85.7 mmHg
Radius: 0.6 cm
S' Lateral: 2.6 cm

## 2022-05-21 ENCOUNTER — Ambulatory Visit: Payer: BC Managed Care – PPO | Admitting: Cardiology

## 2022-05-21 ENCOUNTER — Encounter: Payer: Self-pay | Admitting: Cardiology

## 2022-05-21 VITALS — BP 111/73 | HR 80 | Temp 98.2°F | Resp 16 | Ht 71.0 in | Wt 159.0 lb

## 2022-05-21 DIAGNOSIS — I340 Nonrheumatic mitral (valve) insufficiency: Secondary | ICD-10-CM

## 2022-05-21 DIAGNOSIS — I341 Nonrheumatic mitral (valve) prolapse: Secondary | ICD-10-CM | POA: Diagnosis not present

## 2022-05-21 NOTE — Progress Notes (Signed)
ID:  CANDIDA VETTER, DOB 04-07-1981, MRN [id number]  PCP:  Silverio Lay, MD  Cardiologist:  Tessa Lerner, DO, [facility] (established care 05/09/2022) Former Cardiology Providers: Dr. Bryan Lemma  Date: 05/21/22 Last Office Visit: 05/09/2022  Chief Complaint  Patient presents with   Follow-up    Mitral valve disease    HPI  Andrea Watson is a 41 y.o. Caucasian female who presents to the clinic for evaluation of mitral valve disease/second opinion at the request of Rivard, Dois Davenport, MD. 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  Patient presents today for reevaluation of her mitral regurgitation.  She was last seen in the office earlier this month due to progressive decline in physical stamina/functional status.  She has a history of mitral valve prolapse with mitral regurgitation.  Since last visit she had a repeat echocardiogram which notes preserved LVEF, normal diastolic function, myxomatous mitral valve with moderate MR.  Clinically patient states that her overall physical endurance/functional capacity is relatively the same.  She does not participate in any structured exercise program or daily routine.  She denies angina pectoris, orthopnea, PND, lower extremity swelling.  ALLERGIES: Allergies  Allergen Reactions   Other Other (See Comments), Rash, Nausea Only, Diarrhea and Nausea And Vomiting   Latex Rash   Macrobid [Nitrofurantoin Macrocrystal] Rash   Tetanus Toxoids Nausea And Vomiting and Rash    Chills    MEDICATION LIST PRIOR TO VISIT: Current Meds  Medication Sig   b complex vitamins capsule Take 1 capsule by mouth daily.   Cholecalciferol (VITAMIN D) 50 MCG (2000 UT) CAPS Take 2,000 Units by mouth daily.   drospirenone-ethinyl estradiol (YASMIN,ZARAH,SYEDA) 3-0.03 MG tablet Take 1 tablet by mouth daily.    fexofenadine (ALLEGRA) 180 MG tablet Take  180 mg by mouth daily.   meclizine (ANTIVERT) 25 MG tablet Take 25 mg by mouth as needed.   Multiple Vitamin (MULTIVITAMIN) tablet Take 1 tablet by mouth daily.     PAST MEDICAL HISTORY: Past Medical History:  Diagnosis Date   Autism    History of scoliosis    Repair surgery in childhood   MVP (mitral valve prolapse) 08/2018   With at least moderate MR    PAST SURGICAL HISTORY: Past Surgical History:  Procedure Laterality Date   BACK SURGERY     FOR SCOLIOSIS/Spina bifida occulta repair   TEE WITHOUT CARDIOVERSION N/A 09/23/2018   Procedure: TRANSESOPHAGEAL ECHOCARDIOGRAM (TEE);  Surgeon: Chrystie Nose, MD;  Location: [facility];  Service: Cardiovascular;  Laterality: N/A;   TRANSTHORACIC ECHOCARDIOGRAM  08/18/2018   Normal LV systolic function (EF 55-60%); prolapse of anterior MV leaflet; MR not well interrogated but is eccentric, posteriorly directed and at least moderate; suggest TEE to further assess.   TRANSTHORACIC ECHOCARDIOGRAM  06/2019   a) Normal LV size and function.  EF 60 to 65%.  Normal RV size and function.  Normal atrial sizes.  Moderate MVP (prolapse of anterior leaflet) with Severe Mitral Valve Regurgitation (posteriorly directed) and no stenosis.  Mild TR.  Normal aortic valve.  No LV dilation-either systolic or diastolic.;; b) 04/2020: EF 60 to 65%.  Bileaflet MVPw/ Mod-Severe -highly eccentric MR   TRANSTHORACIC ECHOCARDIOGRAM  05/11/2021   EF 60 to 65%.  Normal wall motion.  Normal strain.  Normal diastolic pressures.  Normal RV.  Myxomatous Mitral Valve with mostly A3 MVP with moderate MR.  Mild late systolic prolapse of both  leaflets.  Normal aortic valve.  Normal IVC/CVP.  No change from previous.   WISDOM TOOTH EXTRACTION      FAMILY HISTORY: The patient family history includes Breast cancer in her paternal grandmother; Cancer in her paternal grandmother; Hypertension in her father; Thyroid disease in her mother.  SOCIAL HISTORY:  The patient  reports  that she has never smoked. She has never used smokeless tobacco. She reports that she does not drink alcohol and does not use drugs.  REVIEW OF SYSTEMS: Review of Systems  Constitutional: Positive for malaise/fatigue.  Cardiovascular:  Negative for chest pain, claudication, dyspnea on exertion, irregular heartbeat, leg swelling, near-syncope, orthopnea, palpitations, paroxysmal nocturnal dyspnea and syncope.  Respiratory:  Negative for shortness of breath.   Hematologic/Lymphatic: Negative for bleeding problem.  Musculoskeletal:  Negative for muscle cramps and myalgias.  Neurological:  Positive for dizziness and light-headedness.    PHYSICAL EXAM:    05/21/2022    2:35 PM 05/09/2022    9:15 AM 06/25/2021    3:24 PM  Vitals with BMI  Height 5\' 11"  5\' 11"  5' 11.75"  Weight 159 lbs 157 lbs 6 oz 154 lbs  BMI 22.19 21.96 21.04  Systolic 111 124  Diastolic 73 83 80  Pulse 80 77 73   Physical Exam  Constitutional: No distress.  Age appropriate, hemodynamically stable.   Neck: No JVD present.  Cardiovascular: Normal rate, regular rhythm, S1 normal, S2 normal, intact distal pulses and normal pulses. Exam reveals no gallop, no S3 and no S4.  Murmur heard. High-pitched blowing holosystolic murmur is present with a grade of 4/6 at the apex. Pulses:      Dorsalis pedis pulses are 2+ on the right side and 2+ on the left side.       Posterior tibial pulses are 2+ on the right side and 2+ on the left side.  Pulmonary/Chest: Effort normal and breath sounds normal. No stridor. She has no wheezes. She has no rales.  Abdominal: Soft. Bowel sounds are normal. She exhibits no distension. There is no abdominal tenderness.  Musculoskeletal:        General: No edema.     Cervical back: Neck supple.  Neurological: She is alert and oriented to person, place, and time. She has intact cranial nerves (2-12).  Skin: Skin is warm and moist.   CARDIAC DATABASE: EKG: 05/09/2022: Sinus Rhythm, 79bpm,  IRBBB, right axis, without underlying injury pattern.   Echocardiogram: 05/11/2021:  1. Left ventricular ejection fraction, by estimation, is 60 to 65%. The left ventricle has normal function. The left ventricle has no regional  wall motion abnormalities. Left ventricular diastolic parameters were normal. The average left ventricular global longitudinal strain is -21.1 %. The global longitudinal strain is normal.   2. Right ventricular systolic function is normal. The right ventricular size is normal.   3. The mitral regurgitant jet appears to originate primarily due to prolapse at A3 (medial segment of the anterior leaflet) and is difficult  to quantify due to its eccentricity. The mitral valve is myxomatous. Moderate mitral valve regurgitation. No evidence of mitral stenosis. There is mild late systolic prolapse of both leaflets of the mitral valve.   4. The aortic valve is tricuspid. Aortic valve regurgitation is not visualized. No aortic stenosis is present.   5. The inferior vena cava is normal in size with greater than 50% respiratory variability, suggesting right atrial pressure of 3 mmHg.   05/17/2022:  1. Left ventricular ejection fraction, by estimation, is  60 to 65%. The  left ventricle has normal function. The left ventricle has no regional  wall motion abnormalities. GLS -21.2%   2. Right ventricular systolic function is normal. The right ventricular  size is normal.   3. The mitral valve appears mildly thickened with myxomatous changes.  There is mild prolapse of the anterior mitral valve leaflet. There is at  least moderate eccentric mitral regurgitation that appears to wrap around  the posterior aspect of the left  atrium. It is not well visualized.   4. The aortic valve is normal in structure. Aortic valve regurgitation is  not visualized. No aortic stenosis is present.   5. The inferior vena cava is normal in size with greater than 50%  respiratory variability, suggesting  right atrial pressure of 3 mmHg.   Stress Testing: No results found for this or any previous visit from the past 1095 days.   Heart Catheterization: None  LABORATORY DATA:    Latest Ref Rng & Units 09/09/2018   12:36 PM 01/01/2017   11:05 AM 01/10/2012    2:37 PM  CBC  WBC 3.4 - 10.8 x10E3/uL 7.2  6.7  11.4   Hemoglobin 11.1 - 15.9 g/dL 14.3  13.8  13.9   Hematocrit 34.0 - 46.6 % 41.2  41.5  41.0   Platelets 150 - 450 x10E3/uL 340  328  335        Latest Ref Rng & Units 09/09/2018   12:36 PM 01/01/2017   11:05 AM  CMP  Glucose 65 - 99 mg/dL 78  79   BUN 6 - 20 mg/dL 8  10   Creatinine 0.57 - 1.00 mg/dL 0.78  0.86   Sodium 134 - 144 mmol/L 139  140   Potassium 3.5 - 5.2 mmol/L 4.4  4.1   Chloride 96 - 106 mmol/L 102  106   CO2 20 - 29 mmol/L 22  23   Calcium 8.7 - 10.2 mg/dL 9.9  9.5   Total Protein 6.1 - 8.1 g/dL  6.8   Total Bilirubin 0.2 - 1.2 mg/dL  0.5   Alkaline Phos 33 - 115 U/L  35   AST 10 - 30 U/L  18   ALT 6 - 29 U/L  19     Lipid Panel     Component Value Date/Time   CHOL 186 01/01/2017 1105   TRIG 130 01/01/2017 1105   HDL 83 01/01/2017 1105   CHOLHDL 2.2 01/01/2017 1105   VLDL 26 01/01/2017 1105   LDLCALC 77 01/01/2017 1105    No components found for: "NTPROBNP" No results for input(s): "PROBNP" in the last 8760 hours. No results for input(s): "TSH" in the last 8760 hours.  BMP No results for input(s): "NA", "K", "CL", "CO2", "GLUCOSE", "BUN", "CREATININE", "CALCIUM", "GFRNONAA", "GFRAA" in the last 8760 hours.  HEMOGLOBIN A1C No results found for: "HGBA1C", "MPG"  IMPRESSION:    ICD-10-CM   1. MVP (mitral valve prolapse)  I34.1     2. Nonrheumatic mitral valve regurgitation  I34.0         RECOMMENDATIONS: Andrea Watson is a 41 y.o. 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  Patient has a known history  of myxomatous mitral valve with bileaflet prolapse and mitral regurgitation.  She had a repeat echocardiogram since last office visit which notes preserved LVEF, normal diastolic function, and moderate MR.  Images were independently  reviewed with the patient, husband at today's office visit.  Her MR appears to be eccentric and not fully appreciated on surface echo.  But I agree with the read that the MRI is at least moderate in severity.  We discussed undergoing transesophageal echocardiogram versus cardiac MRI to further evaluate the severity of MR, LVEF, and chamber volumes.  She is reluctant with additional testing at this time.  The shared decision at today's office visit was to slowly increase physical activity as tolerated and to reevaluate her symptoms in 6 months.  If no change in physical endurance / stamina will proceed with either cardiac MRI or TEE.  If her stamina/physical endurance improves we will continue monitoring her valve with surface echocardiography.  We will hold off on initiation of ACE inhibitor/ARB due to soft blood pressures.  Also reviewed the symptoms associated with acute MR and if present she needs to go to closest ER via EMS for further evaluation and management.  I have asked her to increase her aerobic activity in a stepwise fashion and to avoid isometric contractions if possible.  FINAL MEDICATION LIST END OF ENCOUNTER: No orders of the defined types were placed in this encounter.   There are no discontinued medications.    Current Outpatient Medications:    b complex vitamins capsule, Take 1 capsule by mouth daily., Disp: , Rfl:    Cholecalciferol (VITAMIN D) 50 MCG (2000 UT) CAPS, Take 2,000 Units by mouth daily., Disp: , Rfl:    drospirenone-ethinyl estradiol (YASMIN,ZARAH,SYEDA) 3-0.03 MG tablet, Take 1 tablet by mouth daily. , Disp: , Rfl:    fexofenadine (ALLEGRA) 180 MG tablet, Take 180 mg by mouth daily., Disp: , Rfl:    meclizine (ANTIVERT) 25 MG  tablet, Take 25 mg by mouth as needed., Disp: , Rfl:    Multiple Vitamin (MULTIVITAMIN) tablet, Take 1 tablet by mouth daily., Disp: , Rfl:   No orders of the defined types were placed in this encounter.   There are no Patient Instructions on file for this visit.   --Continue cardiac medications as reconciled in final medication list. --Return in about 6 months (around 11/20/2022) for Follow up mitral regurgitation . or sooner if needed. --Continue follow-up with your primary care physician regarding the management of your other chronic comorbid conditions.  Patient's questions and concerns were addressed to her satisfaction. She voices understanding of the instructions provided during this encounter.   This note was created using a voice recognition software as a result there may be grammatical errors inadvertently enclosed that do not reflect the nature of this encounter. Every attempt is made to correct such errors.  Tessa Lerner, [place], [facility]  Pager: [phone] Office: [phone]

## 2022-05-22 ENCOUNTER — Telehealth: Payer: Self-pay | Admitting: *Deleted

## 2022-05-22 NOTE — Telephone Encounter (Signed)
The patient has been notified of the result and verbalized understanding.  All questions (if any) were answered. Patient states she has seen a second  cardiologist - for second opinion by  PCP 05/21/22. Patient states she wants to cancel upcoming appointment. She would like to  stay with Dr Milus Glazier at [facility]. Appointment cancelled. Raiford Simmonds, RN 05/22/2022 11:55 AM

## 2022-05-22 NOTE — Telephone Encounter (Signed)
-----   Message from Leonie Man, MD sent at 05/19/2022 10:17 AM EDT ----- Relatively stable echocardiogram results: Left ventricular ejection fraction, by estimation, is 60 to 65%. The left ventricle has normal function. The left ventricle has no regional wall motion abnormalities. GLS -21.2% => NORMAL  Mitral Valve Prolapse: the valve still looks thickened with "myxomatous "changes (essentially means floppy). There is at least moderate eccentric mitral regurgitation = STABLE   Stable findings.  Continue to monitor.  Glenetta Hew, MD

## 2022-06-21 DIAGNOSIS — Z23 Encounter for immunization: Secondary | ICD-10-CM | POA: Diagnosis not present

## 2022-07-01 ENCOUNTER — Ambulatory Visit: Payer: BC Managed Care – PPO | Admitting: Cardiology

## 2022-11-08 ENCOUNTER — Ambulatory Visit
Admission: RE | Admit: 2022-11-08 | Discharge: 2022-11-08 | Disposition: A | Discharge status: Home / Self Care | Payer: BC Managed Care – PPO | Source: Ambulatory Visit | Attending: Obstetrics and Gynecology | Admitting: Obstetrics and Gynecology

## 2022-11-08 DIAGNOSIS — N6489 Other specified disorders of breast: Secondary | ICD-10-CM

## 2022-11-08 DIAGNOSIS — Z09 Encounter for follow-up examination after completed treatment for conditions other than malignant neoplasm: Secondary | ICD-10-CM

## 2022-11-21 ENCOUNTER — Encounter: Payer: Self-pay | Admitting: Cardiology

## 2022-11-21 ENCOUNTER — Ambulatory Visit: Payer: BC Managed Care – PPO | Admitting: Cardiology

## 2022-11-21 VITALS — BP 110/82 | HR 80 | Ht 71.0 in | Wt 158.0 lb

## 2022-11-21 DIAGNOSIS — I340 Nonrheumatic mitral (valve) insufficiency: Secondary | ICD-10-CM

## 2022-11-21 DIAGNOSIS — I341 Nonrheumatic mitral (valve) prolapse: Secondary | ICD-10-CM

## 2022-11-21 NOTE — Progress Notes (Signed)
ID:  Andrea Watson, DOB 06/27/1981, MRN [id number]  PCP:  Silverio Lay, MD  Cardiologist:  Tessa Lerner, DO, [facility] [place] (established care 05/09/2022) Former Cardiology Providers: Dr. Bryan Lemma  Date: 11/21/22 Last Office Visit: 05/21/2022  Chief Complaint  Patient presents with   Mitral Valve Prolapse   Follow-up    HPI  Andrea Watson is a 42 y.o. Caucasian female who presents to the clinic for evaluation of mitral valve disease/second opinion at the request of Rivard, Dois Davenport, MD. 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  Patient is accompanied by her husband at today's office visit for evaluation of mitral regurgitation of a 60-month follow-up visit.  She has noted to have a myxomatous mitral valve with prolapse and at least moderate MR on surface echocardiography.  At the last office visit she was feeling tired, fatigued, lightheaded, dizziness.  We discussed undergoing TEE to further evaluate the valve apparatus and the severity of MR.  However she was reluctant to undergo either TEE or MRI for this matter.  Over the last 6 months patient has increased her physical activity she is using a stationary bike at least 15 minutes 3 times a week.  She no longer experiences lightheaded or dizziness.  And she is less tired and fatigued.  She is not checking her blood pressures at home.  Denies any symptoms of heart failure or angina pectoris.  ALLERGIES: Allergies  Allergen Reactions   Other Other (See Comments), Rash, Nausea Only, Diarrhea and Nausea And Vomiting   Latex Rash   Macrobid [Nitrofurantoin Macrocrystal] Rash   Tetanus Toxoids Nausea And Vomiting and Rash    Chills    MEDICATION LIST PRIOR TO VISIT: Current Meds  Medication Sig   b complex vitamins capsule Take 1 capsule by mouth daily.   cetirizine (ZYRTEC) 10 MG tablet Take 10 mg by mouth daily.    Cholecalciferol (VITAMIN D) 50 MCG (2000 UT) CAPS Take 2,000 Units by mouth daily.   drospirenone-ethinyl estradiol (YASMIN,ZARAH,SYEDA) 3-0.03 MG tablet Take 1 tablet by mouth daily.    meclizine (ANTIVERT) 25 MG tablet Take 25 mg by mouth as needed.   Multiple Vitamin (MULTIVITAMIN) tablet Take 1 tablet by mouth daily.     PAST MEDICAL HISTORY: Past Medical History:  Diagnosis Date   Autism    History of scoliosis    Repair surgery in childhood   MVP (mitral valve prolapse) 08/2018   With at least moderate MR    PAST SURGICAL HISTORY: Past Surgical History:  Procedure Laterality Date   BACK SURGERY     FOR SCOLIOSIS/Spina bifida occulta repair   TEE WITHOUT CARDIOVERSION N/A 09/23/2018   Procedure: TRANSESOPHAGEAL ECHOCARDIOGRAM (TEE);  Surgeon: Chrystie Nose, MD;  Location: [facility] ENDOSCOPY;  Service: Cardiovascular;  Laterality: N/A;   TRANSTHORACIC ECHOCARDIOGRAM  08/18/2018   Normal LV systolic function (EF 55-60%); prolapse of anterior MV leaflet; MR not well interrogated but is eccentric, posteriorly directed and at least moderate; suggest TEE to further assess.   TRANSTHORACIC ECHOCARDIOGRAM  06/2019   a) Normal LV size and function.  EF 60 to 65%.  Normal RV size and function.  Normal atrial sizes.  Moderate MVP (prolapse of anterior leaflet) with Severe Mitral Valve Regurgitation (posteriorly directed) and no stenosis.  Mild TR.  Normal aortic valve.  No LV dilation-either systolic or diastolic.;; b) 04/2020: EF 60 to 65%.  Bileaflet MVPw/ Mod-Severe -highly eccentric  MR   TRANSTHORACIC ECHOCARDIOGRAM  05/11/2021   EF 60 to 65%.  Normal wall motion.  Normal strain.  Normal diastolic pressures.  Normal RV.  Myxomatous Mitral Valve with mostly A3 MVP with moderate MR.  Mild late systolic prolapse of both leaflets.  Normal aortic valve.  Normal IVC/CVP.  No change from previous.   WISDOM TOOTH EXTRACTION      FAMILY HISTORY: The patient family history includes Breast cancer in  her paternal grandmother; Cancer in her paternal grandmother; Hypertension in her father; Thyroid disease in her mother.  SOCIAL HISTORY:  The patient  reports that she has never smoked. She has never used smokeless tobacco. She reports that she does not drink alcohol and does not use drugs.  REVIEW OF SYSTEMS: Review of Systems  Constitutional: Positive for malaise/fatigue (much improved).  Cardiovascular:  Negative for chest pain, claudication, dyspnea on exertion, irregular heartbeat, leg swelling, near-syncope, orthopnea, palpitations, paroxysmal nocturnal dyspnea and syncope.  Respiratory:  Negative for shortness of breath.   Hematologic/Lymphatic: Negative for bleeding problem.  Musculoskeletal:  Negative for muscle cramps and myalgias.  Neurological:  Negative for dizziness and light-headedness.    PHYSICAL EXAM:    11/21/2022    2:22 PM 05/21/2022    2:35 PM 05/09/2022    9:15 AM  Vitals with BMI  Height 5\' 11"  5\' 11"  5\' 11"   Weight 158 lbs 159 lbs 157 lbs 6 oz  BMI 22.05 22.19 21.96  Systolic 133 111 161  Diastolic 95 73 83  Pulse 80 80 77   Physical Exam  Constitutional: No distress.  Age appropriate, hemodynamically stable.   Neck: No JVD present.  Cardiovascular: Normal rate, regular rhythm, S1 normal, S2 normal, intact distal pulses and normal pulses. Exam reveals no gallop, no S3 and no S4.  Murmur heard. High-pitched blowing holosystolic murmur is present with a grade of 4/6 at the apex. Pulses:      Dorsalis pedis pulses are 2+ on the right side and 2+ on the left side.       Posterior tibial pulses are 2+ on the right side and 2+ on the left side.  Pulmonary/Chest: Effort normal and breath sounds normal. No stridor. She has no wheezes. She has no rales.  Abdominal: Soft. Bowel sounds are normal. She exhibits no distension. There is no abdominal tenderness.  Musculoskeletal:        General: No edema.     Cervical back: Neck supple.  Neurological: She is alert  and oriented to person, place, and time. She has intact cranial nerves (2-12).  Skin: Skin is warm and moist.   CARDIAC DATABASE: EKG: November 21, 2022: Sinus rhythm, 80 bpm, IRBBB, without underlying injury pattern.  Echocardiogram: 05/11/2021:  1. Left ventricular ejection fraction, by estimation, is 60 to 65%. The left ventricle has normal function. The left ventricle has no regional  wall motion abnormalities. Left ventricular diastolic parameters were normal. The average left ventricular global longitudinal strain is -21.1 %. The global longitudinal strain is normal.   2. Right ventricular systolic function is normal. The right ventricular size is normal.   3. The mitral regurgitant jet appears to originate primarily due to prolapse at A3 (medial segment of the anterior leaflet) and is difficult  to quantify due to its eccentricity. The mitral valve is myxomatous. Moderate mitral valve regurgitation. No evidence of mitral stenosis. There is mild late systolic prolapse of both leaflets of the mitral valve.   4. The aortic valve is tricuspid.  Aortic valve regurgitation is not visualized. No aortic stenosis is present.   5. The inferior vena cava is normal in size with greater than 50% respiratory variability, suggesting right atrial pressure of 3 mmHg.   05/17/2022:  1. Left ventricular ejection fraction, by estimation, is 60 to 65%. The  left ventricle has normal function. The left ventricle has no regional  wall motion abnormalities. GLS -21.2%   2. Right ventricular systolic function is normal. The right ventricular  size is normal.   3. The mitral valve appears mildly thickened with myxomatous changes.  There is mild prolapse of the anterior mitral valve leaflet. There is at  least moderate eccentric mitral regurgitation that appears to wrap around  the posterior aspect of the left  atrium. It is not well visualized.   4. The aortic valve is normal in structure. Aortic valve  regurgitation is  not visualized. No aortic stenosis is present.   5. The inferior vena cava is normal in size with greater than 50%  respiratory variability, suggesting right atrial pressure of 3 mmHg.   Stress Testing: No results found for this or any previous visit from the past 1095 days.   Heart Catheterization: None  LABORATORY DATA:    Latest Ref Rng & Units 09/09/2018   12:36 PM 01/01/2017   11:05 AM 01/10/2012    2:37 PM  CBC  WBC 3.4 - 10.8 x10E3/uL 7.2  6.7  11.4   Hemoglobin 11.1 - 15.9 g/dL 16.1  09.6  04.5   Hematocrit 34.0 - 46.6 % 41.2  41.5  41.0   Platelets 150 - 450 x10E3/uL 340  328  335        Latest Ref Rng & Units 09/09/2018   12:36 PM 01/01/2017   11:05 AM  CMP  Glucose 65 - 99 mg/dL 78  79   BUN 6 - 20 mg/dL 8  10   Creatinine 4.09 - 1.00 mg/dL 8.11  9.14   Sodium 782 - 144 mmol/L 139  140   Potassium 3.5 - 5.2 mmol/L 4.4  4.1   Chloride 96 - 106 mmol/L 102  106   CO2 20 - 29 mmol/L 22  23   Calcium 8.7 - 10.2 mg/dL 9.9  9.5   Total Protein 6.1 - 8.1 g/dL  6.8   Total Bilirubin 0.2 - 1.2 mg/dL  0.5   Alkaline Phos 33 - 115 U/L  35   AST 10 - 30 U/L  18   ALT 6 - 29 U/L  19     Lipid Panel     Component Value Date/Time   CHOL 186 01/01/2017 1105   TRIG 130 01/01/2017 1105   HDL 83 01/01/2017 1105   CHOLHDL 2.2 01/01/2017 1105   VLDL 26 01/01/2017 1105   LDLCALC 77 01/01/2017 1105    No components found for: "NTPROBNP" No results for input(s): "PROBNP" in the last 8760 hours. No results for input(s): "TSH" in the last 8760 hours.  BMP No results for input(s): "NA", "K", "CL", "CO2", "GLUCOSE", "BUN", "CREATININE", "CALCIUM", "GFRNONAA", "GFRAA" in the last 8760 hours.  HEMOGLOBIN A1C No results found for: "HGBA1C", "MPG"  IMPRESSION:    ICD-10-CM   1. MVP (mitral valve prolapse)  I34.1 EKG 12-Lead    ECHOCARDIOGRAM COMPLETE    2. Nonrheumatic mitral valve regurgitation  I34.0 ECHOCARDIOGRAM COMPLETE         RECOMMENDATIONS: Andrea Watson is a 42 y.o. 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  Patient is noted to have underlying mitral valve disease dating back to at least 2020.  Her underlying pathology is myxomatous mitral valve with mitral valve prolapse and mitral regurgitation.  Her last transesophageal echocardiogram was in 2020 and she has been followed for her mitral disease by multiple cardiologist in the past.  Patient had a surface echocardiogram in October 2023 which noted an eccentric MR jet wrapping around the posterior aspect of the left atrium.    Since last office visit she has been exercising more and has started to develop better endurance.  Denies heart failure symptoms.  No longer experiences dizziness or lightheadedness.  And overall fatigue has also improved.  She still remains reluctant to repeat a TEE as a 4-year follow-up study; therefore, recommend proceeding with a TTE prior to next office visit as a 1 year follow-up study to reevaluate LVEF, left ventricular dimensions, left atrial enlargement, and the severity of MR.  Patient is asked to seek medical attention sooner if she has episodes of palpitations, lightheaded/dizziness/syncope, or heart failure symptoms.  Have also asked her to keep a log of her blood pressures.  Initial blood pressures in the office were greater than 130 mmHg but after recheck it is 110 mmHg.  FINAL MEDICATION LIST END OF ENCOUNTER: No orders of the defined types were placed in this encounter.   There are no discontinued medications.    Current Outpatient Medications:    b complex vitamins capsule, Take 1 capsule by mouth daily., Disp: , Rfl:    cetirizine (ZYRTEC) 10 MG tablet, Take 10 mg by mouth daily., Disp: , Rfl:    Cholecalciferol (VITAMIN D) 50 MCG (2000 UT) CAPS, Take 2,000 Units by mouth daily.,  Disp: , Rfl:    drospirenone-ethinyl estradiol (YASMIN,ZARAH,SYEDA) 3-0.03 MG tablet, Take 1 tablet by mouth daily. , Disp: , Rfl:    meclizine (ANTIVERT) 25 MG tablet, Take 25 mg by mouth as needed., Disp: , Rfl:    Multiple Vitamin (MULTIVITAMIN) tablet, Take 1 tablet by mouth daily., Disp: , Rfl:    fexofenadine (ALLEGRA) 180 MG tablet, Take 180 mg by mouth daily. (Patient not taking: Reported on 11/21/2022), Disp: , Rfl:   Orders Placed This Encounter  Procedures   EKG 12-Lead   ECHOCARDIOGRAM COMPLETE     There are no Patient Instructions on file for this visit.   --Continue cardiac medications as reconciled in final medication list. --Return in about 28 weeks (around 06/05/2023) for Follow up mitral valve disease. or sooner if needed. --Continue follow-up with your primary care physician regarding the management of your other chronic comorbid conditions.  Patient's questions and concerns were addressed to her satisfaction. She voices understanding of the instructions provided during this encounter.   This note was created using a voice recognition software as a result there may be grammatical errors inadvertently enclosed that do not reflect the nature of this encounter. Every attempt is made to correct such errors.  Tessa Lerner, [place], [facility]  Pager:  [phone] Office: [phone]

## 2023-01-28 IMAGING — US US BREAST*L* LIMITED INC AXILLA
1 series · 4 of 4 positions shown · non-contrast
Comparison: Previous exam(s).

CLINICAL DATA: Patient recalled from screening for left breast
asymmetry.

EXAM:
DIGITAL DIAGNOSTIC UNILATERAL LEFT MAMMOGRAM WITH TOMOSYNTHESIS AND
CAD; ULTRASOUND LEFT BREAST LIMITED
TECHNIQUE: Left digital diagnostic mammography and breast tomosynthesis was
performed. The images were evaluated with computer-aided detection.;
Targeted ultrasound examination of the left breast was performed.

[Series 1: us breast*left* limited inc axilla · 0.06mm/px · 4 of 4 slices shown]
[im 1/4]
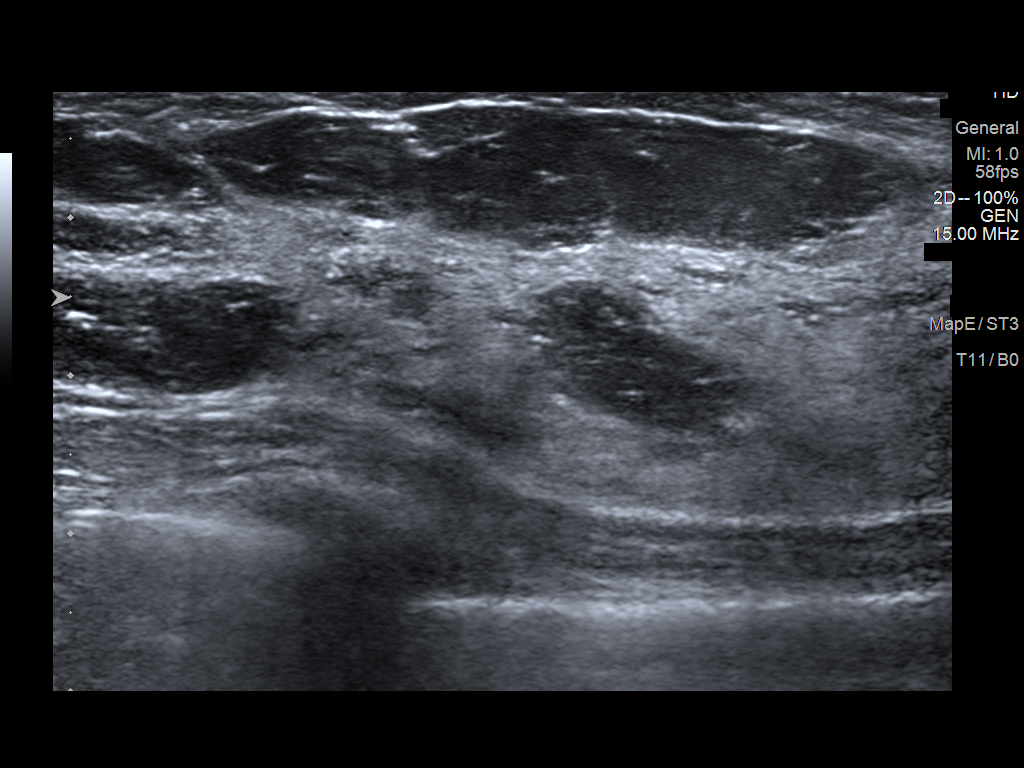
[im 2/4]
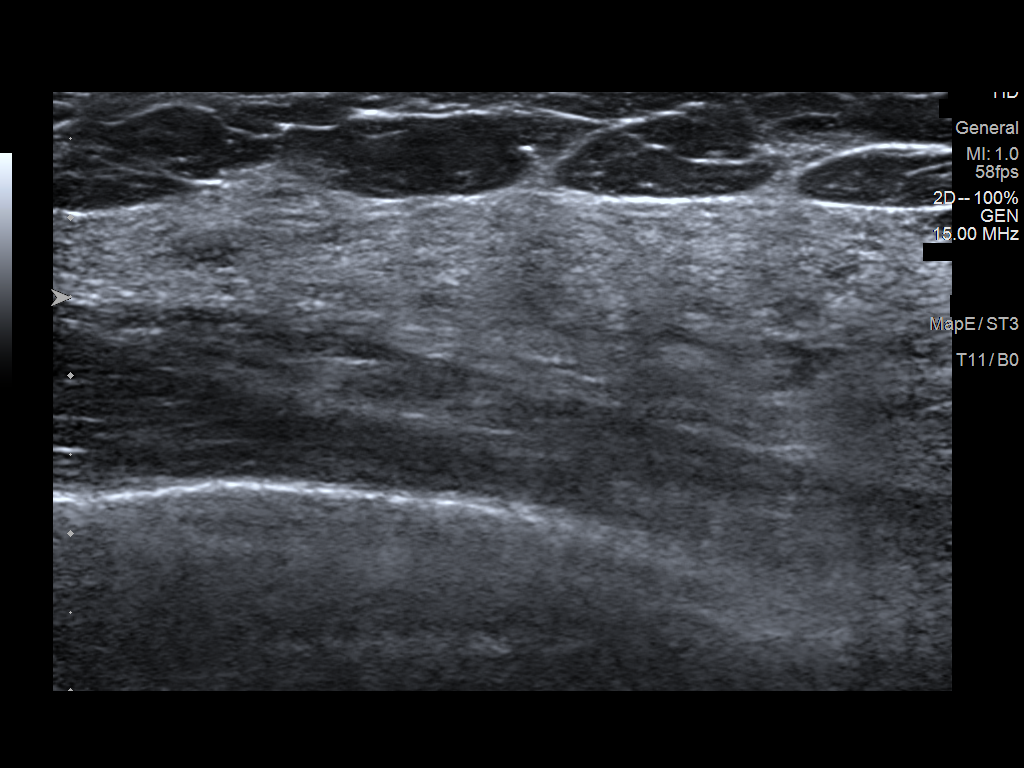
[im 3/4]
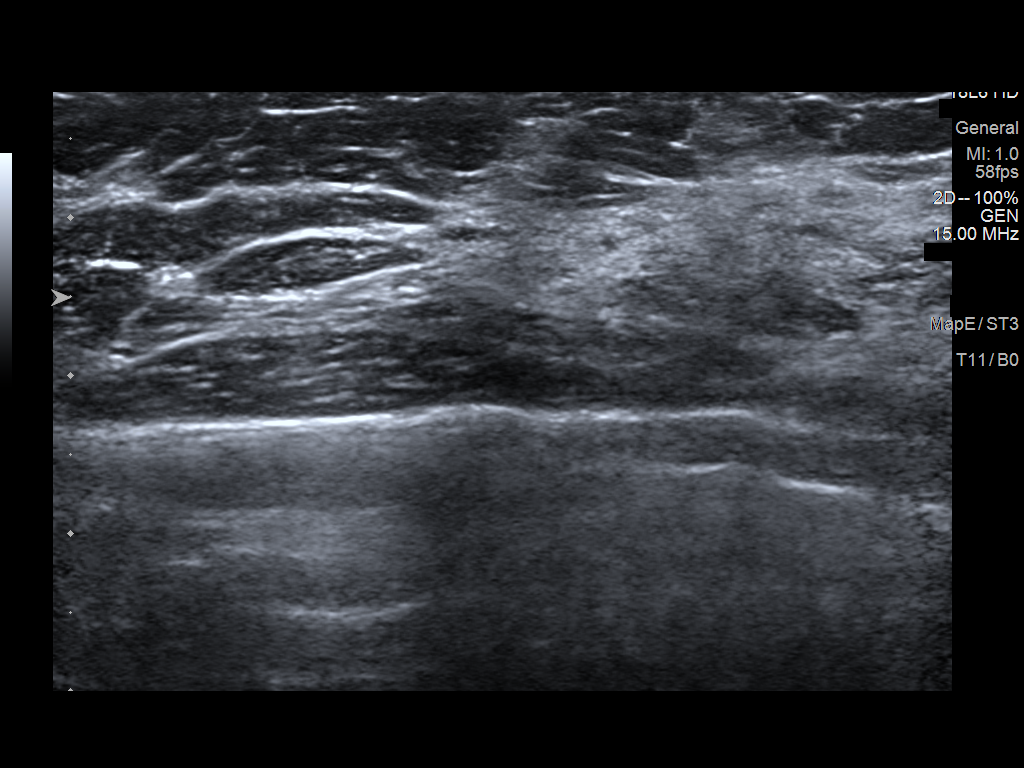
[im 4/4]
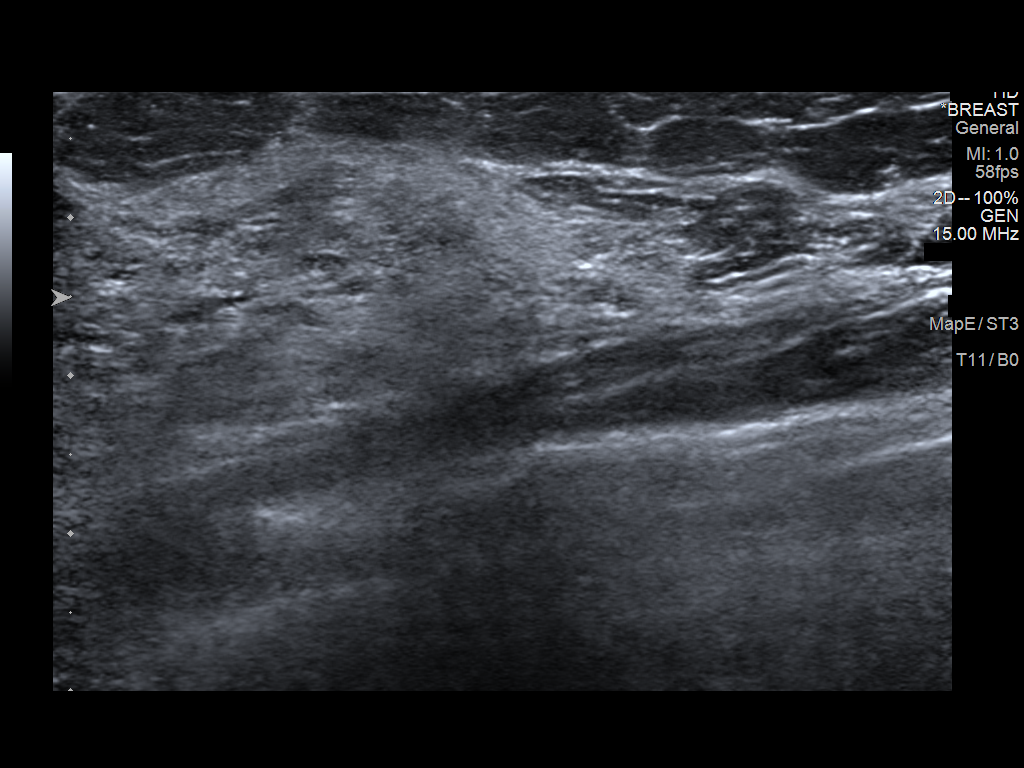

[4 of 4 positions shown; findings below may reference images not displayed]

ACR Breast Density Category c: The breast tissue is heterogeneously
dense, which may obscure small masses.
FINDINGS: Questioned asymmetry within the superior left breast on the MLO view
partially effaced with additional imaging suggestive of dense
fibroglandular tissue.

Targeted ultrasound is performed, showing normal dense tissue
without suspicious mass within the upper-outer left breast.
IMPRESSION: Probably benign asymmetry superior left breast on the MLO view.

RECOMMENDATION:
Left breast diagnostic mammogram and possible ultrasound in 6 months
to reassess probably benign left breast asymmetry.

I have discussed the findings and recommendations with the patient.
If applicable, a reminder letter will be sent to the patient
regarding the next appointment.

BI-RADS CATEGORY  3: Probably benign.

## 2023-05-21 ENCOUNTER — Telehealth: Payer: Self-pay | Admitting: Cardiology

## 2023-05-21 NOTE — Telephone Encounter (Signed)
Per Dr Herbie Baltimore: 05/2022 At this point, since the echo has been pretty stable for several years now I think he probably move out to every 2 years. Bryan Lemma, MD   Pt will see Dr Odis Hollingshead on 06/09/23 and will talk with him at her OV if he would like to move up the echo sooner... pt says she is not having any difficulties.

## 2023-05-21 NOTE — Telephone Encounter (Signed)
Patient calling in about having a echo done. Please advise

## 2023-05-22 NOTE — Telephone Encounter (Signed)
  Patient sent MyChart message stating:  The November 4th appointment is supposed to be a follow-up appointment for the echocardiogram. It can't be a follow-up if I don't have an echocardiogram scheduled. The echo was canceled when he switched from working from a different office to working for this office.

## 2023-05-22 NOTE — Telephone Encounter (Signed)
LMOVM requesting call back to discuss scheduling echo. Direct number provided.

## 2023-06-05 ENCOUNTER — Ambulatory Visit: Payer: BC Managed Care – PPO | Admitting: Cardiology

## 2023-06-06 NOTE — Telephone Encounter (Signed)
LMOVM requesting call back to discuss scheduling echo. Direct number provided.

## 2023-06-09 ENCOUNTER — Ambulatory Visit: Payer: BC Managed Care – PPO | Attending: Cardiology | Admitting: Cardiology

## 2023-06-09 ENCOUNTER — Encounter: Payer: Self-pay | Admitting: Cardiology

## 2023-06-09 VITALS — BP 128/86 | HR 88 | Resp 16 | Ht 71.0 in | Wt 162.6 lb

## 2023-06-09 DIAGNOSIS — I340 Nonrheumatic mitral (valve) insufficiency: Secondary | ICD-10-CM

## 2023-06-09 DIAGNOSIS — I341 Nonrheumatic mitral (valve) prolapse: Secondary | ICD-10-CM

## 2023-06-09 NOTE — Telephone Encounter (Signed)
Echo has been scheduled for 06/10/23.

## 2023-06-09 NOTE — Patient Instructions (Addendum)
Medication Instructions:  Your physician recommends that you continue on your current medications as directed. Please refer to the Current Medication list given to you today.  *If you need a refill on your cardiac medications before your next appointment, please call your pharmacy*  Lab Work: None ordered today. If you have labs (blood work) drawn today and your tests are completely normal, you will receive your results only by: MyChart Message (if you have MyChart) OR A paper copy in the mail If you have any lab test that is abnormal or we need to change your treatment, we will call you to review the results.  Testing/Procedures: Your physician has requested that you have an echocardiogram as soon as possible. Echocardiography is a painless test that uses sound waves to create images of your heart. It provides your doctor with information about the size and shape of your heart and how well your heart's chambers and valves are working. This procedure takes approximately one hour. There are no restrictions for this procedure. Please do NOT wear cologne, perfume, aftershave, or lotions (deodorant is allowed). Please arrive 15 minutes prior to your appointment time.  Please note: We ask at that you not bring children with you during ultrasound (echo/ vascular) testing. Due to room size and safety concerns, children are not allowed in the ultrasound rooms during exams. Our front office staff cannot provide observation of children in our lobby area while testing is being conducted. An adult accompanying a patient to their appointment will only be allowed in the ultrasound room at the discretion of the ultrasound technician under special circumstances. We apologize for any inconvenience.   Follow-Up: At [facility], you and your health needs are our priority.  As part of our continuing mission to provide you with exceptional heart care, we have created designated Provider Care Teams.  These Care  Teams include your primary Cardiologist (physician) and Advanced Practice Providers (APPs -  Physician Assistants and Nurse Practitioners) who all work together to provide you with the care you need, when you need it.   Your next appointment:   6 month(s)  The format for your next appointment:   In Person  Provider:   Tessa Lerner, DO {  Other Instructions Complete echocardiogram as soon as possible.

## 2023-06-09 NOTE — Progress Notes (Signed)
Cardiology Office Note:  .   Date:  06/09/2023  ID:  Andrea Watson, DOB [birth year], MRN [id number] PCP:  Silverio Lay, MD  Former Cardiology Providers: Dr. Bryan Lemma [facility] Providers Cardiologist:  Tessa Lerner, DO , [facility] (established care 05/09/2022) Electrophysiologist:  None  Click to update primary MD,subspecialty MD or APP then REFRESH:1}    Chief Complaint  Patient presents with   Follow-up    Mitral valve disease     History of Present Illness: . e  Andrea Watson is a 42 y.o. 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   Last 6 months she denies any anginal chest pain or heart failure symptoms.  Overall functional capacity remains stable-participates in exercise 15 minutes at least 3 times a week on a stationary bike.   Overall functional status remains stable.  She is supposed to have an echocardiogram prior to today's office visit but this is still pending.  Review of Systems: .   Review of Systems  Cardiovascular:  Negative for chest pain, claudication, irregular heartbeat, leg swelling, near-syncope, orthopnea, palpitations, paroxysmal nocturnal dyspnea and syncope.  Respiratory:  Negative for shortness of breath.   Hematologic/Lymphatic: Negative for bleeding problem.    Studies Reviewed:   EKG: EKG Interpretation Date/Time:  Monday June 09 2023 13:12:52 EST Ventricular Rate:  88 PR Interval:  154 QRS Duration:  112 QT Interval:  366 QTC Calculation: 442 R Axis:   101  Text Interpretation: Normal sinus rhythm Rightward axis Incomplete right bundle branch block No previous ECGs available Confirmed by Tessa Lerner [phone]) on 06/09/2023 1:14:49 PM  Echocardiogram: 05/17/2022:  1. Left ventricular ejection fraction, by estimation, is 60 to 65%. The  left ventricle has normal function. The left  ventricle has no regional  wall motion abnormalities. GLS -21.2%   2. Right ventricular systolic function is normal. The right ventricular  size is normal.   3. The mitral valve appears mildly thickened with myxomatous changes.  There is mild prolapse of the anterior mitral valve leaflet. There is at  least moderate eccentric mitral regurgitation that appears to wrap around  the posterior aspect of the left  atrium. It is not well visualized.   4. The aortic valve is normal in structure. Aortic valve regurgitation is  not visualized. No aortic stenosis is present.   5. The inferior vena cava is normal in size with greater than 50%  respiratory variability, suggesting right atrial pressure of 3 mmHg  RADIOLOGY: NA  Risk Assessment/Calculations:   NA   Labs:       Latest Ref Rng & Units 09/09/2018   12:36 PM 01/01/2017   11:05 AM 01/10/2012    2:37 PM  CBC  WBC 3.4 - 10.8 x10E3/uL 7.2  6.7  11.4   Hemoglobin 11.1 - 15.9 g/dL 62.1  30.8  65.7   Hematocrit 34.0 - 46.6 % 41.2  41.5  41.0   Platelets 150 - 450 x10E3/uL 340  328  335        Latest Ref Rng & Units 09/09/2018   12:36 PM 01/01/2017   11:05 AM  BMP  Glucose 65 - 99 mg/dL 78  79   BUN 6 - 20 mg/dL 8  10   Creatinine 8.46 - 1.00 mg/dL 9.62  9.52   BUN/Creat Ratio 9 - 23 10    Sodium 134 - 144 mmol/L 139  140   Potassium 3.5 - 5.2 mmol/L 4.4  4.1   Chloride 96 - 106 mmol/L 102  106   CO2 20 - 29 mmol/L 22  23   Calcium 8.7 - 10.2 mg/dL 9.9  9.5       Latest Ref Rng & Units 09/09/2018   12:36 PM 01/01/2017   11:05 AM  CMP  Glucose 65 - 99 mg/dL 78  79   BUN 6 - 20 mg/dL 8  10   Creatinine 1.61 - 1.00 mg/dL 0.96  0.45   Sodium 409 - 144 mmol/L 139  140   Potassium 3.5 - 5.2 mmol/L 4.4  4.1   Chloride 96 - 106 mmol/L 102  106   CO2 20 - 29 mmol/L 22  23   Calcium 8.7 - 10.2 mg/dL 9.9  9.5   Total Protein 6.1 - 8.1 g/dL  6.8   Total Bilirubin 0.2 - 1.2 mg/dL  0.5   Alkaline Phos 33 - 115 U/L  35   AST 10 - 30 U/L   18   ALT 6 - 29 U/L  19     Lab Results  Component Value Date   CHOL 186 01/01/2017   HDL 83 01/01/2017   LDLCALC 77 01/01/2017   TRIG 130 01/01/2017   CHOLHDL 2.2 01/01/2017   No results for input(s): "LIPOA" in the last 8760 hours. No components found for: "NTPROBNP" No results for input(s): "PROBNP" in the last 8760 hours. No results for input(s): "TSH" in the last 8760 hours.   Physical Exam:    Today's Vitals   06/09/23 1309  BP: 128/86  Pulse: 88  Resp: 16  SpO2: 98%  Weight: 162 lb 9.6 oz (73.8 kg)  Height: 5\' 11"  (1.803 m)   Body mass index is 22.68 kg/m. Wt Readings from Last 3 Encounters:  06/09/23 162 lb 9.6 oz (73.8 kg)  11/21/22 158 lb (71.7 kg)  05/21/22 159 lb (72.1 kg)    Physical Exam  Constitutional: No distress.  hemodynamically stable  Neck: No JVD present.  Cardiovascular: Normal rate, regular rhythm, S1 normal, S2 normal, intact distal pulses and normal pulses. Exam reveals no gallop, no S3 and no S4.  No murmur heard. High-pitched blowing holosystolic murmur is present at the apex. Pulses:      Dorsalis pedis pulses are 2+ on the right side and 2+ on the left side.       Posterior tibial pulses are 2+ on the right side and 2+ on the left side.  Pulmonary/Chest: Effort normal and breath sounds normal. No stridor. She has no wheezes. She has no rales.  Abdominal: Soft. Bowel sounds are normal. She exhibits no distension. There is no abdominal tenderness.  Musculoskeletal:        General: No edema.     Cervical back: Neck supple.  Neurological: She is alert and oriented to person, place, and time. She has intact cranial nerves (2-12).  Skin: Skin is warm and moist.     Impression & Recommendation(s):  Impression:   ICD-10-CM   1. MVP (mitral valve prolapse)  I34.1 EKG 12-Lead    2. Nonrheumatic mitral valve regurgitation  I34.0 ECHOCARDIOGRAM COMPLETE       Recommendation(s):  MVP (mitral valve prolapse) Nonrheumatic mitral valve  regurgitation Clinically remains asymptomatic. Overall functional capacity remains stable. Denies palpitations, near-syncope, syncopal events. Echocardiogram prior to today's office visit is still pending.  Based on the results of the echocardiogram further recommendations will be provided.  However, if the echo remains relatively stable recommend a follow-up visit in 6 months.  Patient is asked to seek medical attention sooner if she has new onset of palpitations or starts to develop heart failure symptoms.  Orders Placed:  Orders Placed This Encounter  Procedures   EKG 12-Lead   ECHOCARDIOGRAM COMPLETE    Standing Status:   Future    Standing Expiration Date:   06/08/2024    Scheduling Instructions:     Needed to have an echo prior to the 11/4 visit but it was still pending. Dr. Odis Hollingshead asks for this to be completed as soon as possible    Order Specific Question:   Where should this test be performed    Answer:   [facility])    Order Specific Question:   Does the patient weigh less than or greater than 250 lbs?    Answer:   Patient weighs less than 250 lbs    Order Specific Question:   Perflutren DEFINITY (image enhancing agent) should be administered unless hypersensitivity or allergy exist    Answer:   Administer Perflutren    Order Specific Question:   Reason for exam-Echo    Answer:   Other-Full Diagnosis List    Order Specific Question:   Full ICD-10/Reason for Exam    Answer:   Mitral valve regurgitation [201049]    Final Medication List:   No orders of the defined types were placed in this encounter.   Medications Discontinued During This Encounter  Medication Reason   b complex vitamins capsule Change in therapy   Cholecalciferol (VITAMIN D) 50 MCG (2000 UT) CAPS Change in therapy     Current Outpatient Medications:    cetirizine (ZYRTEC) 10 MG tablet, Take 10 mg by mouth daily., Disp: , Rfl:    drospirenone-ethinyl estradiol (YASMIN,ZARAH,SYEDA)  3-0.03 MG tablet, Take 1 tablet by mouth daily. , Disp: , Rfl:    fexofenadine (ALLEGRA) 180 MG tablet, Take 180 mg by mouth daily., Disp: , Rfl:    meclizine (ANTIVERT) 25 MG tablet, Take 25 mg by mouth as needed., Disp: , Rfl:    Multiple Vitamin (MULTIVITAMIN) tablet, Take 1 tablet by mouth daily., Disp: , Rfl:   Consent:   NA  Disposition:   Follow-up in 6 months or sooner if needed-for mitral valve disease.  Patient may be asked to follow-up sooner based on the results of the above-mentioned testing.  Her questions and concerns were addressed to her satisfaction. She voices understanding of the recommendations provided during this encounter.    Signed, Tessa Lerner, DO, [facility]  [facility]  [address] [place], [place] [address] 06/09/2023 5:33 PM

## 2023-06-10 ENCOUNTER — Ambulatory Visit (HOSPITAL_COMMUNITY): Payer: BC Managed Care – PPO | Attending: Cardiology

## 2023-06-10 DIAGNOSIS — I340 Nonrheumatic mitral (valve) insufficiency: Secondary | ICD-10-CM | POA: Insufficient documentation

## 2023-06-10 LAB — ECHOCARDIOGRAM COMPLETE
Area-P 1/2: 3.62 cm2
MV M vel: 5.19 m/s
MV Peak grad: 107.7 mm[Hg]
S' Lateral: 2.5 cm

## 2023-06-13 ENCOUNTER — Other Ambulatory Visit: Payer: Self-pay

## 2023-06-13 DIAGNOSIS — I340 Nonrheumatic mitral (valve) insufficiency: Secondary | ICD-10-CM

## 2023-12-26 ENCOUNTER — Other Ambulatory Visit: Payer: Self-pay | Admitting: Obstetrics and Gynecology

## 2023-12-26 DIAGNOSIS — Z1231 Encounter for screening mammogram for malignant neoplasm of breast: Secondary | ICD-10-CM

## 2024-01-02 ENCOUNTER — Ambulatory Visit
Admission: RE | Admit: 2024-01-02 | Discharge: 2024-01-02 | Disposition: A | Discharge status: Home / Self Care | Source: Ambulatory Visit | Attending: Obstetrics and Gynecology | Admitting: Obstetrics and Gynecology

## 2024-01-02 DIAGNOSIS — Z1231 Encounter for screening mammogram for malignant neoplasm of breast: Secondary | ICD-10-CM

## 2024-01-05 ENCOUNTER — Ambulatory Visit
Admission: RE | Admit: 2024-01-05 | Discharge: 2024-01-05 | Disposition: A | Discharge status: Home / Self Care | Source: Ambulatory Visit | Attending: Obstetrics and Gynecology | Admitting: Obstetrics and Gynecology

## 2024-01-05 ENCOUNTER — Other Ambulatory Visit: Payer: Self-pay | Admitting: Obstetrics and Gynecology

## 2024-01-05 DIAGNOSIS — N6489 Other specified disorders of breast: Secondary | ICD-10-CM

## 2024-06-10 ENCOUNTER — Ambulatory Visit (HOSPITAL_COMMUNITY)
Admission: RE | Admit: 2024-06-10 | Discharge: 2024-06-10 | Disposition: A | Discharge status: Home / Self Care | Source: Ambulatory Visit | Attending: Cardiovascular Disease | Admitting: Cardiovascular Disease

## 2024-06-10 DIAGNOSIS — I340 Nonrheumatic mitral (valve) insufficiency: Secondary | ICD-10-CM | POA: Diagnosis present

## 2024-06-10 LAB — ECHOCARDIOGRAM COMPLETE
Area-P 1/2: 4.57 cm2
Radius: 1.2 cm
S' Lateral: 2.9 cm

## 2024-06-15 ENCOUNTER — Ambulatory Visit: Payer: Self-pay | Admitting: Cardiology

## 2024-06-30 NOTE — Progress Notes (Signed)
 Patient viewed in [place]     Last read by Andrea Watson at 12:43PM on 06/23/2024.

## 2024-07-21 ENCOUNTER — Ambulatory Visit: Admitting: Cardiology

## 2024-08-26 NOTE — Progress Notes (Unsigned)
 " Cardiology Office Note:    Date:  08/27/2024   ID:  Andrea Watson, DOB October 08, 1980, MRN [id number]  PCP:  Darcel Pool, MD   [facility] Providers Cardiologist:  Madonna Large, DO     Referring MD: Darcel Pool, MD   Chief complaint: Follow-up of recent echo     History of Present Illness:   Andrea Watson is a 44 y.o. 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  Initially referred to cardiology in 2020 following a murmur noted on exam by PCP.  Echo 08/18/2018 demonstrated LVEF: 55-60%, MV prolapse with moderate regurgitation.  TEE 09/2018 showing moderate-severe posteriorly directed eccentric MR with holosystolic prolapse of the anterior leaflet, recommended follow-up echo every 6-12 months.  Interim visits were unremarkable until 05/2022 when patient reported progressive decline in physical stamina and functioning.  She was advised to advance activity as tolerated.  Most recent echo 06/10/2024 demonstrating LVEF 55-60%, no RWMA, normal diastolic parameters, mild LA dilatation.  Mitral valve with bileaflet prolapse, moderate-severe regurgitation with Coanda effect in the fourth chamber.  Moderate-severe mitral valve regurgitation present.  Presents independently, appears stable from a cardiovascular standpoint. She denies chest pain, palpitations, dyspnea, orthopnea, n, v, dark/tarry/bloody stools, hematuria, dizziness, syncope, edema, weight gain.  She is exercising 2-3 times a week riding a stationary bike for 20-30 minutes in her garage without exacerbation of any new symptoms.  Homeschools her 4 year old son who is about to graduate.  Able to do light-moderate work around the house without any limitation to her activities.  ROS:   Please see the history of present illness.    All other systems reviewed and are negative.     Past Medical History:  Diagnosis Date   Autism    History of scoliosis     Repair surgery in childhood   MVP (mitral valve prolapse) 08/2018   With at least moderate MR    Past Surgical History:  Procedure Laterality Date   BACK SURGERY     FOR SCOLIOSIS/Spina bifida occulta repair   TEE WITHOUT CARDIOVERSION N/A 09/23/2018   Procedure: TRANSESOPHAGEAL ECHOCARDIOGRAM (TEE);  Surgeon: Mona Vinie BROCKS, MD;  Location: [facility];  Service: Cardiovascular;  Laterality: N/A;   TRANSTHORACIC ECHOCARDIOGRAM  08/18/2018   Normal LV systolic function (EF 55-60%); prolapse of anterior MV leaflet; MR not well interrogated but is eccentric, posteriorly directed and at least moderate; suggest TEE to further assess.   TRANSTHORACIC ECHOCARDIOGRAM  06/2019   a) Normal LV size and function.  EF 60 to 65%.  Normal RV size and function.  Normal atrial sizes.  Moderate MVP (prolapse of anterior leaflet) with Severe Mitral Valve Regurgitation (posteriorly directed) and no stenosis.  Mild TR.  Normal aortic valve.  No LV dilation-either systolic or diastolic.;; b) 04/2020: EF 60 to 65%.  Bileaflet MVPw/ Mod-Severe -highly eccentric MR   TRANSTHORACIC ECHOCARDIOGRAM  05/11/2021   EF 60 to 65%.  Normal wall motion.  Normal strain.  Normal diastolic pressures.  Normal RV.  Myxomatous Mitral Valve with mostly A3 MVP with moderate MR.  Mild late systolic prolapse of both leaflets.  Normal aortic valve.  Normal IVC/CVP.  No change from previous.   WISDOM TOOTH EXTRACTION      Current Medications: Active Medications[1]   Allergies:   Other, Latex, Macrobid [nitrofurantoin macrocrystal], and Tetanus toxoid-containing vaccines   Social History   Socioeconomic History   Marital status: Married    Spouse  name: Not on file   Number of children: 1   Years of education: Not on file   Highest education level: Not on file  Occupational History   Not on file  Tobacco Use   Smoking status: Never   Smokeless tobacco: Never  Vaping Use   Vaping status: Never Used  Substance and Sexual  Activity   Alcohol use: No   Drug use: No   Sexual activity: Yes    Birth control/protection: None  Other Topics Concern   Not on file  Social History Narrative   Not on file   Social Drivers of Health   Tobacco Use: Low Risk (08/27/2024)   Patient History    Smoking Tobacco Use: Never    Smokeless Tobacco Use: Never    Passive Exposure: Not on file  Financial Resource Strain: Low Risk (04/07/2024)   Received from [facility]   Overall Financial Resource Strain (CARDIA)    How hard is it for you to pay for the very basics like food, housing, medical care, and heating?: Not hard at all  Food Insecurity: No Food Insecurity (04/07/2024)   Received from [facility]   Epic    Within the past 12 months, you worried that your food would run out before you got the money to buy more.: Never true    Within the past 12 months, the food you bought just didn't last and you didn't have money to get more.: Never true  Transportation Needs: No Transportation Needs (04/07/2024)   Received [facility]    In the past 12 months, has lack of transportation kept you from medical appointments or from getting medications?: No    In the past 12 months, has lack of transportation kept you from meetings, work, or from getting things needed for daily living?: No  Physical Activity: Insufficiently Active (04/07/2024)   Received from [facility]   Exercise Vital Sign    On average, how many days per week do you engage in moderate to strenuous exercise (like a brisk walk)?: 3 days    On average, how many minutes do you engage in exercise at this level?: 20 min  Stress: Patient Declined (04/07/2024)   Received from [facility] Occupational [facility] - Occupational Stress Questionnaire    Do you feel stress - tense, restless, nervous, or anxious, or unable to sleep at night because your mind is troubled all the time - these days?: Patient declined  Social Connections: Socially  Integrated (04/07/2024)   Received from [facility]   Social Network    How would you rate your social network (family, work, friends)?: Good participation with social networks  Depression (PHQ2-9): Not on file  Alcohol Screen: Not on file  Housing: Low Risk (04/07/2024)   Received from [facility]    In the last 12 months, was there a time when you were not able to pay the mortgage or rent on time?: No    In the past 12 months, how many times have you moved where you were living?: 0    At any time in the past 12 months, were you homeless or living in a shelter (including now)?: No  Utilities: Not At Risk (04/07/2024)   Received from [facility]    In the past 12 months has the electric, gas, oil, or water company threatened to shut off services in your home?: No  Health  Literacy: Not on file     Family History: The patient's family history includes Breast cancer in her paternal grandmother; Cancer in her paternal grandmother; Hypertension in her father; Thyroid  disease in her mother.  EKGs/Labs/Other Studies Reviewed:    The following studies were reviewed today:  EKG Interpretation Date/Time:  Friday August 27 2024 10:07:22 EST Ventricular Rate:  83 PR Interval:  124 QRS Duration:  112 QT Interval:  382 QTC Calculation: 448 R Axis:   105  Text Interpretation: Normal sinus rhythm Rightward axis Incomplete right bundle branch block When compared with ECG of 09-Jun-2023 13:12, No significant change was found Confirmed by Andrea Watson [id number]) on 08/27/2024 10:11:43 AM    Recent Labs: No results found for requested labs within last 365 days.  Recent Lipid Panel    Component Value Date/Time   CHOL 186 01/01/2017 1105   TRIG 130 01/01/2017 1105   HDL 83 01/01/2017 1105   CHOLHDL 2.2 01/01/2017 1105   VLDL 26 01/01/2017 1105   LDLCALC 77 01/01/2017 1105     Risk Assessment/Calculations:                Physical Exam:    VS:  BP 116/72   Pulse 83    Ht 5' 10 (1.778 m)   Wt 159 lb (72.1 kg)   SpO2 97%   BMI 22.81 kg/m        Wt Readings from Last 3 Encounters:  08/27/24 159 lb (72.1 kg)  06/09/23 162 lb 9.6 oz (73.8 kg)  11/21/22 158 lb (71.7 kg)     GEN:  Well nourished, well developed in no acute distress HEENT: Normal NECK:  No carotid bruits CARDIAC:  S1-S2 normal, RRR, 2/6 systolic murmur over PMI, rubs, gallops RESPIRATORY:  Clear to auscultation without rales, wheezing or rhonchi  MUSCULOSKELETAL:  No edema; No deformity  SKIN: Warm and dry NEUROLOGIC:  Alert and oriented x 3 PSYCHIATRIC:  Normal affect       Assessment & Plan MVP (mitral valve prolapse) Nonrheumatic mitral valve regurgitation Systolic murmur 06/10/2024 echo: LVEF 55-60%, no RWMA, normal diastolic parameters, mild LA dilatation.  Mitral valve with bileaflet prolapse, moderate-severe regurgitation with Coanda effect in the fourth chamber.  Moderate-severe mitral valve regurgitation present.  We spent the majority of the visit discussing the pathophysiology and how they applied to her echo results.  All questions were answered. EKG: Normal sinus rhythm, Rightward axis, Incomplete right bundle branch block, no significant change from prior studies. Patient asymptomatic, denies any decline in functional capacity or exercise tolerance. Appears euvolemic, slight murmur auscultated over PMI Will continue to monitor MVP at this time, patient instructed that if she were to develop any new symptoms to notify the office that she may need closer monitoring and potential cMRI in the future.  Disposition: Follow-up in 1 year with Dr. Michele, or sooner if needed.  Proceed to the ED with any new or worsening symptoms.           Medication Adjustments/Labs and Tests Ordered: Current medicines are reviewed at length with the patient today.  Concerns regarding medicines are outlined above.  Orders Placed This Encounter  Procedures   EKG 12-Lead   No orders  of the defined types were placed in this encounter.   Patient Instructions  Medication Instructions:   Your physician recommends that you continue on your current medications as directed. Please refer to the Current Medication list given to you today.  *If you need a refill  on your cardiac medications before your next appointment, please call your pharmacy*  Lab Work: NONE ORDERED  TODAY   If you have labs (blood work) drawn today and your tests are completely normal, you will receive your results only by: MyChart Message (if you have MyChart) OR A paper copy in the mail If you have any lab test that is abnormal or we need to change your treatment, we will call you to review the results.  Testing/Procedures: NONE ORDERED  TODAY   Follow-Up: At [facility], you and your health needs are our priority.  As part of our continuing mission to provide you with exceptional heart care, our providers are all part of one team.  This team includes your primary Cardiologist (physician) and Advanced Practice Providers or APPs (Physician Assistants and Nurse Practitioners) who all work together to provide you with the care you need, when you need it.  Your next appointment:   1 year(s)  Provider:   Madonna Large, DO    We recommend signing up for the patient portal called MyChart.  Sign up information is provided on this After Visit Summary.  MyChart is used to connect with patients for Virtual Visits (Telemedicine).  Patients are able to view lab/test results, encounter notes, upcoming appointments, etc.  Non-urgent messages can be sent to your provider as well.   To learn more about what you can do with MyChart, go to [email or web address].   Other Instructions              Signed, Miriam FORBES Shams, NP  08/27/2024 10:25 AM    [facility]     [1]  Current Meds  Medication Sig   cetirizine (ZYRTEC) 10 MG tablet Take 10 mg by mouth daily.    drospirenone-ethinyl estradiol  (YASMIN,ZARAH,SYEDA) 3-0.03 MG tablet Take 1 tablet by mouth daily.    fexofenadine (ALLEGRA) 180 MG tablet Take 180 mg by mouth daily.   meclizine (ANTIVERT) 25 MG tablet Take 25 mg by mouth as needed.   Multiple Vitamin (MULTIVITAMIN) tablet Take 1 tablet by mouth daily.   "

## 2024-08-27 ENCOUNTER — Ambulatory Visit: Attending: Emergency Medicine | Admitting: Emergency Medicine

## 2024-08-27 ENCOUNTER — Encounter: Payer: Self-pay | Admitting: Emergency Medicine

## 2024-08-27 VITALS — BP 116/72 | HR 83 | Ht 70.0 in | Wt 159.0 lb

## 2024-08-27 DIAGNOSIS — I340 Nonrheumatic mitral (valve) insufficiency: Secondary | ICD-10-CM

## 2024-08-27 DIAGNOSIS — I341 Nonrheumatic mitral (valve) prolapse: Secondary | ICD-10-CM

## 2024-08-27 DIAGNOSIS — R011 Cardiac murmur, unspecified: Secondary | ICD-10-CM | POA: Diagnosis not present

## 2024-08-27 NOTE — Patient Instructions (Signed)
 Medication Instructions:   Your physician recommends that you continue on your current medications as directed. Please refer to the Current Medication list given to you today.  *If you need a refill on your cardiac medications before your next appointment, please call your pharmacy*  Lab Work: NONE ORDERED  TODAY   If you have labs (blood work) drawn today and your tests are completely normal, you will receive your results only by: MyChart Message (if you have MyChart) OR A paper copy in the mail If you have any lab test that is abnormal or we need to change your treatment, we will call you to review the results.  Testing/Procedures: NONE ORDERED  TODAY   Follow-Up: At [facility], you and your health needs are our priority.  As part of our continuing mission to provide you with exceptional heart care, our providers are all part of one team.  This team includes your primary Cardiologist (physician) and Advanced Practice Providers or APPs (Physician Assistants and Nurse Practitioners) who all work together to provide you with the care you need, when you need it.  Your next appointment:   1 year(s)  Provider:   Madonna Large, DO    We recommend signing up for the patient portal called MyChart.  Sign up information is provided on this After Visit Summary.  MyChart is used to connect with patients for Virtual Visits (Telemedicine).  Patients are able to view lab/test results, encounter notes, upcoming appointments, etc.  Non-urgent messages can be sent to your provider as well.   To learn more about what you can do with MyChart, go to [email or web address].   Other Instructions

## 2024-08-27 NOTE — Assessment & Plan Note (Signed)
 06/10/2024 echo: LVEF 55-60%, no RWMA, normal diastolic parameters, mild LA dilatation.  Mitral valve with bileaflet prolapse, moderate-severe regurgitation with Coanda effect in the fourth chamber.  Moderate-severe mitral valve regurgitation present.  We spent the majority of the visit discussing the pathophysiology and how they applied to her echo results.  All questions were answered. EKG: Normal sinus rhythm, Rightward axis, Incomplete right bundle branch block, no significant change from prior studies. Patient asymptomatic, denies any decline in functional capacity or exercise tolerance. Appears euvolemic, slight murmur auscultated over PMI Will continue to monitor MVP at this time, patient instructed that if she were to develop any new symptoms to notify the office that she may need closer monitoring and potential cMRI in the future.

## 2024-08-31 NOTE — Telephone Encounter (Signed)
-----   Message from [place], [place] sent at 08/29/2024  6:16 PM EST ----- Please arrange follow-up, nonurgent  Dr. Michele

## 2024-08-31 NOTE — Telephone Encounter (Signed)
 LMTCB

## 2024-09-08 NOTE — Telephone Encounter (Signed)
-----   Message from [place], [place] sent at 08/29/2024  6:16 PM EST ----- Please arrange follow-up, nonurgent  Dr. Michele

## 2024-09-08 NOTE — Telephone Encounter (Signed)
 LMTCB
